# Patient Record
Sex: Female | Born: 1995
Health system: Southern US, Community
[De-identification: ages and names within clinical notes are randomized; demographics above are authoritative.]

## PROBLEM LIST (undated history)

## (undated) DIAGNOSIS — J45909 Unspecified asthma, uncomplicated: Secondary | ICD-10-CM

## (undated) DIAGNOSIS — M549 Dorsalgia, unspecified: Secondary | ICD-10-CM

## (undated) DIAGNOSIS — E079 Disorder of thyroid, unspecified: Secondary | ICD-10-CM

## (undated) DIAGNOSIS — B009 Herpesviral infection, unspecified: Secondary | ICD-10-CM

## (undated) DIAGNOSIS — E669 Obesity, unspecified: Secondary | ICD-10-CM

## (undated) DIAGNOSIS — M62838 Other muscle spasm: Secondary | ICD-10-CM

## (undated) DIAGNOSIS — K219 Gastro-esophageal reflux disease without esophagitis: Secondary | ICD-10-CM

## (undated) HISTORY — DX: Unspecified asthma, uncomplicated: J45.909

## (undated) HISTORY — PX: WISDOM TOOTH EXTRACTION: SHX21

## (undated) HISTORY — DX: Other muscle spasm: M62.838

## (undated) HISTORY — DX: Dorsalgia, unspecified: M54.9

## (undated) HISTORY — PX: TOE SURGERY: SHX1073

## (undated) HISTORY — DX: Herpesviral infection, unspecified: B00.9

## (undated) HISTORY — DX: Gastro-esophageal reflux disease without esophagitis: K21.9

## (undated) HISTORY — DX: Obesity, unspecified: E66.9

## (undated) HISTORY — DX: Disorder of thyroid, unspecified: E07.9

---
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
## 2017-01-25 ENCOUNTER — Ambulatory Visit: Payer: Medicaid Other

## 2017-01-28 ENCOUNTER — Ambulatory Visit (INDEPENDENT_AMBULATORY_CARE_PROVIDER_SITE_OTHER): Payer: Medicaid Other

## 2017-01-28 DIAGNOSIS — Z3042 Encounter for surveillance of injectable contraceptive: Secondary | ICD-10-CM

## 2017-01-28 DIAGNOSIS — Z308 Encounter for other contraceptive management: Secondary | ICD-10-CM | POA: Diagnosis not present

## 2017-01-28 MED ORDER — MEDROXYPROGESTERONE ACETATE 150 MG/ML IM SUSP
150.0000 mg | Freq: Once | INTRAMUSCULAR | Status: AC
  Administered 2017-01-28: 150 mg via INTRAMUSCULAR

## 2017-01-28 NOTE — Progress Notes (Signed)
Nurse visit for pt's supplied Depo given R del w/o difficulty. Pt aware Next Depo due date.

## 2017-04-26 ENCOUNTER — Ambulatory Visit (INDEPENDENT_AMBULATORY_CARE_PROVIDER_SITE_OTHER): Payer: Medicaid Other

## 2017-04-26 DIAGNOSIS — Z308 Encounter for other contraceptive management: Secondary | ICD-10-CM

## 2017-04-26 DIAGNOSIS — Z3042 Encounter for surveillance of injectable contraceptive: Secondary | ICD-10-CM | POA: Diagnosis not present

## 2017-04-26 MED ORDER — MEDROXYPROGESTERONE ACETATE 150 MG/ML IM SUSP
150.0000 mg | Freq: Once | INTRAMUSCULAR | Status: AC
  Administered 2017-04-26: 150 mg via INTRAMUSCULAR

## 2017-04-26 NOTE — Progress Notes (Signed)
Nurse visit for pt supply Depo. Pt is on time for injection. Depo given R Del w/o difficulty. Next Depo due 11/20. AEX due 05/2017. Pt agrees.

## 2017-07-12 ENCOUNTER — Other Ambulatory Visit: Payer: Self-pay | Admitting: Certified Nurse Midwife

## 2017-07-12 ENCOUNTER — Telehealth: Payer: Self-pay | Admitting: *Deleted

## 2017-07-12 ENCOUNTER — Other Ambulatory Visit: Payer: Self-pay

## 2017-07-12 ENCOUNTER — Ambulatory Visit (INDEPENDENT_AMBULATORY_CARE_PROVIDER_SITE_OTHER): Payer: Medicaid Other | Admitting: Obstetrics

## 2017-07-12 ENCOUNTER — Other Ambulatory Visit (HOSPITAL_COMMUNITY)
Admission: RE | Admit: 2017-07-12 | Discharge: 2017-07-12 | Disposition: A | Discharge status: Home / Self Care | Payer: Medicaid Other | Source: Ambulatory Visit | Attending: Obstetrics | Admitting: Obstetrics

## 2017-07-12 ENCOUNTER — Encounter: Payer: Self-pay | Admitting: Obstetrics

## 2017-07-12 VITALS — BP 129/87 | HR 87 | Ht 68.0 in | Wt 365.0 lb

## 2017-07-12 DIAGNOSIS — Z01419 Encounter for gynecological examination (general) (routine) without abnormal findings: Secondary | ICD-10-CM

## 2017-07-12 DIAGNOSIS — E66813 Obesity, class 3: Secondary | ICD-10-CM

## 2017-07-12 DIAGNOSIS — Z01411 Encounter for gynecological examination (general) (routine) with abnormal findings: Secondary | ICD-10-CM | POA: Insufficient documentation

## 2017-07-12 DIAGNOSIS — Z309 Encounter for contraceptive management, unspecified: Secondary | ICD-10-CM | POA: Diagnosis not present

## 2017-07-12 DIAGNOSIS — N946 Dysmenorrhea, unspecified: Secondary | ICD-10-CM | POA: Diagnosis not present

## 2017-07-12 DIAGNOSIS — Z3042 Encounter for surveillance of injectable contraceptive: Secondary | ICD-10-CM | POA: Diagnosis not present

## 2017-07-12 DIAGNOSIS — F17210 Nicotine dependence, cigarettes, uncomplicated: Secondary | ICD-10-CM | POA: Insufficient documentation

## 2017-07-12 DIAGNOSIS — N898 Other specified noninflammatory disorders of vagina: Secondary | ICD-10-CM | POA: Diagnosis not present

## 2017-07-12 DIAGNOSIS — Z6841 Body Mass Index (BMI) 40.0 and over, adult: Secondary | ICD-10-CM | POA: Diagnosis not present

## 2017-07-12 MED ORDER — MEDROXYPROGESTERONE ACETATE 150 MG/ML IM SUSP
150.0000 mg | Freq: Once | INTRAMUSCULAR | Status: AC
  Administered 2017-07-12: 150 mg via INTRAMUSCULAR

## 2017-07-12 MED ORDER — MEDROXYPROGESTERONE ACETATE 150 MG/ML IM SUSP: 150.0000 mg | INTRAMUSCULAR | 4 refills | Status: DC

## 2017-07-12 NOTE — Telephone Encounter (Signed)
Rx sent to pharmacy   

## 2017-07-12 NOTE — Telephone Encounter (Signed)
Please review for refill.  

## 2017-07-12 NOTE — Progress Notes (Signed)
Subjective:        Patricia Duran is a 21 y.o. female here for a routine exam.  Current complaints: Occasional breast tenderness.  Personal health questionnaire:  Is patient Ashkenazi Jewish, have a family history of breast and/or ovarian cancer: no Is there a family history of uterine cancer diagnosed at age < 10650, gastrointestinal cancer, urinary tract cancer, family member who is a Personnel officerLynch syndrome-associated carrier: no Is the patient overweight and hypertensive, family history of diabetes, personal history of gestational diabetes, preeclampsia or PCOS: no Is patient over 155, have PCOS,  family history of premature CHD under age 19, diabetes, smoke, have hypertension or peripheral artery disease:  no At any time, has a partner hit, kicked or otherwise hurt or frightened you?: no Over the past 2 weeks, have you felt down, depressed or hopeless?: no Over the past 2 weeks, have you felt little interest or pleasure in doing things?:no   Gynecologic History No LMP recorded. Patient has had an injection. Contraception: Depo-Provera injections Last Pap: n/a. Results were: n/a Last mammogram: n/a. Results were: n/a  Obstetric History OB History  Gravida Para Term Preterm AB Living  0 0 0 0 0 0  SAB TAB Ectopic Multiple Live Births  0 0 0 0 0        Past Medical History:  Diagnosis Date  . Asthma   . Obesity     Past Surgical History:  Procedure Laterality Date  . TOE SURGERY       Current Outpatient Medications:  .  cyclobenzaprine (FLEXERIL) 10 MG tablet, Take 1 tablet (10 mg total) by mouth 2 (two) times daily as needed for muscle spasms., Disp: 20 tablet, Rfl: 0 .  medroxyPROGESTERone (DEPO-PROVERA) 150 MG/ML injection, INJECT 1 ML INTO MUSCLE EVERY 3 MONTHS, Disp: 1 mL, Rfl: 4 .  medroxyPROGESTERone (DEPO-PROVERA) 150 MG/ML injection, Inject 1 mL (150 mg total) into the muscle every 3 (three) months., Disp: 1 mL, Rfl: 4 .  albuterol (PROVENTIL HFA;VENTOLIN HFA)  108 (90 BASE) MCG/ACT inhaler, Inhale 1 puff into the lungs every 6 (six) hours as needed for wheezing or shortness of breath., Disp: , Rfl:  .  ibuprofen (ADVIL,MOTRIN) 800 MG tablet, Take 1 tablet (800 mg total) by mouth 3 (three) times daily. (Patient not taking: Reported on 11/18/2016), Disp: 21 tablet, Rfl: 0 No Known Allergies  Social History   Tobacco Use  . Smoking status: Current Some Day Smoker    Types: Cigars  . Smokeless tobacco: Never Used  . Tobacco comment: black and milds  Substance Use Topics  . Alcohol use: No    Family History  Problem Relation Age of Onset  . Asthma Other   . Hypertension Other   . Hyperlipidemia Other   . Sleep apnea Other   . Stroke Other   . Heart attack Other       Review of Systems  Constitutional: negative for fatigue and weight loss Respiratory: negative for cough and wheezing Cardiovascular: negative for chest pain, fatigue and palpitations Gastrointestinal: negative for abdominal pain and change in bowel habits Musculoskeletal:negative for myalgias Neurological: negative for gait problems and tremors Behavioral/Psych: negative for abusive relationship, depression Endocrine: negative for temperature intolerance    Genitourinary:negative for abnormal menstrual periods, genital lesions, hot flashes, sexual problems and vaginal discharge Integument/breast: positive for breast tenderness    Objective:       BP 129/87   Pulse 87   Ht 5\' 8"  (1.727 m)   Wt Marland Kitchen(!)  365 lb (165.6 kg)   BMI 55.50 kg/m  General:   alert  Skin:   no rash or abnormalities  Lungs:   clear to auscultation bilaterally  Heart:   regular rate and rhythm, S1, S2 normal, no murmur, click, rub or gallop  Breasts:   normal without suspicious masses, skin or nipple changes or axillary nodes  Abdomen:  normal findings: no organomegaly, soft, non-tender and no hernia  Pelvis:  External genitalia: normal general appearance Urinary system: urethral meatus normal and  bladder without fullness, nontender Vaginal: normal without tenderness, induration or masses Cervix: normal appearance Adnexa: normal bimanual exam Uterus: anteverted and non-tender, normal size   Lab Review Urine pregnancy test Labs reviewed yes Radiologic studies reviewed no  50% of 20 min visit spent on counseling and coordination of care.    Assessment:     1. Well woman exam Rx: - Cervicovaginal ancillary only  2. Surveillance for Depo-Provera contraception - pleased with Depo  3. Vaginal discharge Rx: - Cervicovaginal ancillary only  4. Class 3 severe obesity due to excess calories without serious comorbidity with body mass index (BMI) of 50.0 to 59.9 in adult [facility](HCC) - recommended program that includes caloric restriction. Exercise and behavioral modification  5. Dysmenorrhea - stable on Depo   6. Tobacco dependence due to cigarettes - tobacco cessation recommended - medical therapy recommended ,I.e Chantix   Plan:    Education reviewed: calcium supplements, depression evaluation, low fat, low cholesterol diet, safe sex/STD prevention, self breast exams, smoking cessation and weight bearing exercise. Contraception: Depo-Provera injections. Follow up in: 1 year.   Meds ordered this encounter  Medications  . medroxyPROGESTERone (DEPO-PROVERA) injection 150 mg   No orders of the defined types were placed in this encounter.

## 2017-07-12 NOTE — Telephone Encounter (Signed)
Pt has an appt tody for 3pm for Annual /depo but patient has nor refills at pharmacy,please advise..Marland Kitchen

## 2017-07-12 NOTE — Progress Notes (Signed)
Pt was given depo injection at today's visit. Pt supplied depo for today's visit.  Pt tolerated well. Pt advised to RTO 2/5-19/2019 for next injection.   Administrations This Visit    medroxyPROGESTERone (DEPO-PROVERA) injection 150 mg    Admin Date 07/12/2017 Action Given Dose 150 mg Route Intramuscular Administered By Lanney GinsFoster, Suzanne D, CMA

## 2017-07-13 LAB — CERVICOVAGINAL ANCILLARY ONLY
Bacterial vaginitis: NEGATIVE
CHLAMYDIA, DNA PROBE: NEGATIVE
Candida vaginitis: NEGATIVE
Neisseria Gonorrhea: NEGATIVE
TRICH (WINDOWPATH): NEGATIVE

## 2017-10-03 ENCOUNTER — Ambulatory Visit: Payer: Medicaid Other

## 2017-10-06 ENCOUNTER — Telehealth: Payer: Self-pay | Admitting: *Deleted

## 2017-10-06 NOTE — Telephone Encounter (Signed)
ERROR

## 2017-10-07 ENCOUNTER — Encounter: Payer: Self-pay | Admitting: *Deleted

## 2017-10-07 ENCOUNTER — Ambulatory Visit (INDEPENDENT_AMBULATORY_CARE_PROVIDER_SITE_OTHER): Payer: BLUE CROSS/BLUE SHIELD | Admitting: Pediatrics

## 2017-10-07 DIAGNOSIS — Z3042 Encounter for surveillance of injectable contraceptive: Secondary | ICD-10-CM | POA: Diagnosis not present

## 2017-10-07 MED ORDER — MEDROXYPROGESTERONE ACETATE 150 MG/ML IM SUSP
150.0000 mg | Freq: Once | INTRAMUSCULAR | Status: AC
  Administered 2017-10-07: 150 mg via INTRAMUSCULAR

## 2017-10-07 NOTE — Progress Notes (Signed)
Chart reviewed for nurse visit. Agree with plan of care.   Rolm Bookbindereill, [place] Dominique M, [place] 10/07/2017 11:34 AM

## 2017-12-30 ENCOUNTER — Encounter (HOSPITAL_COMMUNITY): Payer: Self-pay

## 2017-12-30 ENCOUNTER — Emergency Department (HOSPITAL_COMMUNITY): Payer: BLUE CROSS/BLUE SHIELD

## 2017-12-30 ENCOUNTER — Emergency Department (HOSPITAL_COMMUNITY)
Admission: EM | Admit: 2017-12-30 | Discharge: 2017-12-30 | Disposition: A | Discharge status: Home / Self Care | Payer: BLUE CROSS/BLUE SHIELD | Attending: Emergency Medicine | Admitting: Emergency Medicine

## 2017-12-30 DIAGNOSIS — B9789 Other viral agents as the cause of diseases classified elsewhere: Secondary | ICD-10-CM | POA: Insufficient documentation

## 2017-12-30 DIAGNOSIS — J029 Acute pharyngitis, unspecified: Secondary | ICD-10-CM | POA: Diagnosis present

## 2017-12-30 DIAGNOSIS — J069 Acute upper respiratory infection, unspecified: Secondary | ICD-10-CM

## 2017-12-30 DIAGNOSIS — F17210 Nicotine dependence, cigarettes, uncomplicated: Secondary | ICD-10-CM | POA: Insufficient documentation

## 2017-12-30 DIAGNOSIS — R0981 Nasal congestion: Secondary | ICD-10-CM | POA: Diagnosis not present

## 2017-12-30 DIAGNOSIS — R05 Cough: Secondary | ICD-10-CM | POA: Insufficient documentation

## 2017-12-30 LAB — GROUP A STREP BY PCR: GROUP A STREP BY PCR: NOT DETECTED

## 2017-12-30 NOTE — Discharge Instructions (Addendum)
Please read instructions below.  Your chest x-ray does not show pneumonia.  Your strep test is negative. This is likely a viral illness and will run its course. You can take tylenol or ibuprofen every 6 hours as needed for sore throat. Drink plenty of water.  Use saline nasal spray for congestion and to prevent postnasal drip - this can contribute to your sore throat and cough. Follow up with your primary care provider as needed.  Return to the ER for inability to swallow liquids, difficulty breathing, or new or worsening symptoms.

## 2017-12-30 NOTE — ED Provider Notes (Signed)
[facility]-EMERGENCY DEPT Provider Note   CSN: [id number] Arrival date & time: 12/30/17  0153     History   Chief Complaint No chief complaint on file.   HPI Patricia Duran is a 22 y.o. female w PMHx asthma, presenting to the ED with complaint of sore throat for about 1 week.  Patient states sore throat is worse with swallowing.  Reports associated runny nose/congestion, and resolved right ear ache.  She states she began having a mild infrequent cough x2 days.  States it is productive of clear and sometimes brownish sputum.  Was [facility] prior to returning home yesterday.  She was given Tylenol and allergy medication for symptoms, which she took for 2 days and stopped due to lack of relief.  No difficulty with her asthma, no fever, difficulty breathing or swallowing, or known sick contacts.  The history is provided by the patient.    Past Medical History:  Diagnosis Date  . Asthma   . Obesity     Patient Active Problem List   Diagnosis Date Noted  . Dysmenorrhea 05/27/2016    Past Surgical History:  Procedure Laterality Date  . TOE SURGERY       OB History    Gravida  0   Para  0   Term  0   Preterm  0   AB  0   Living  0     SAB  0   TAB  0   Ectopic  0   Multiple  0   Live Births  0            Home Medications    Prior to Admission medications   Medication Sig Start Date End Date Taking? Authorizing Provider  albuterol (PROVENTIL HFA;VENTOLIN HFA) 108 (90 BASE) MCG/ACT inhaler Inhale 1 puff into the lungs every 6 (six) hours as needed for wheezing or shortness of breath.    [provider]  cyclobenzaprine (FLEXERIL) 10 MG tablet Take 1 tablet (10 mg total) by mouth 2 (two) times daily as needed for muscle spasms. 07/24/16   Blane Ohara, MD  ibuprofen (ADVIL,MOTRIN) 800 MG tablet Take 1 tablet (800 mg total) by mouth 3 (three) times daily. 06/09/14   Elpidio Anis,  PA-C  medroxyPROGESTERone (DEPO-PROVERA) 150 MG/ML injection INJECT 1 ML INTO MUSCLE EVERY 3 MONTHS 07/12/17   Brock Bad, MD  medroxyPROGESTERone (DEPO-PROVERA) 150 MG/ML injection Inject 1 mL (150 mg total) into the muscle every 3 (three) months. 07/12/17   Roe Coombs, CNM    Family History Family History  Problem Relation Age of Onset  . Asthma Other   . Hypertension Other   . Hyperlipidemia Other   . Sleep apnea Other   . Stroke Other   . Heart attack Other     Social History Social History   Tobacco Use  . Smoking status: Current Some Day Smoker    Types: Cigars  . Smokeless tobacco: Never Used  . Tobacco comment: black and milds  Substance Use Topics  . Alcohol use: No  . Drug use: No     Allergies   Patient has no known allergies.   Review of Systems Review of Systems  Constitutional: Negative for fever.  HENT: Positive for congestion, ear pain, rhinorrhea and sore throat. Negative for trouble swallowing and voice change.   Respiratory: Positive for cough. Negative for shortness of breath.      Physical Exam  Updated Vital Signs BP (!) 144/97 (BP Location: Left Arm)   Pulse 100   Temp 99 F (37.2 C) (Oral)   Resp 18   SpO2 98%   Physical Exam  Constitutional: She appears well-developed and well-nourished. No distress.  HENT:  Head: Normocephalic and atraumatic.  Right Ear: Tympanic membrane, external ear and ear canal normal.  Left Ear: Tympanic membrane, external ear and ear canal normal.  Nose: Nose normal.  Mouth/Throat: Uvula is midline and oropharynx is clear and moist. No trismus in the jaw. No uvula swelling. No posterior oropharyngeal edema.  Eyes: Conjunctivae are normal.  Neck: Normal range of motion. Neck supple.  Cardiovascular: Normal rate, regular rhythm, normal heart sounds and intact distal pulses.  Pulmonary/Chest: Effort normal and breath sounds normal. No respiratory distress.  Lymphadenopathy:    She has no  cervical adenopathy.  Psychiatric: She has a normal mood and affect. Her behavior is normal.  Nursing note and vitals reviewed.    ED Treatments / Results  Labs (all labs ordered are listed, but only abnormal results are displayed) Labs Reviewed  GROUP A STREP BY PCR    EKG None  Radiology Dg Chest 2 View  Result Date: 12/30/2017 CLINICAL DATA:  22 year old female with productive cough. EXAM: CHEST - 2 VIEW COMPARISON:  None. FINDINGS: The heart size and mediastinal contours are within normal limits. Both lungs are clear. The visualized skeletal structures are unremarkable. IMPRESSION: No active cardiopulmonary disease. Electronically Signed   By: Elgie Collard M.D.   On: 12/30/2017 02:48    Procedures Procedures (including critical care time)  Medications Ordered in ED Medications - No data to display   Initial Impression / Assessment and Plan / ED Course  I have reviewed the triage vital signs and the nursing notes.  Pertinent labs & imaging results that were available during my care of the patient were reviewed by me and considered in my medical decision making (see chart for details).     Patients symptoms are consistent with URI, likely viral etiology. Afebrile, tolerating secretions.  Lungs clear to auscultation bilaterally. CXR ordered in triage is negative for acute infiltrate. Strep swab done in triage is negative. Discussed that antibiotics are not indicated for viral infections. Pt will be discharged with symptomatic treatment.  Verbalizes understanding and is agreeable with plan. Pt is hemodynamically stable & in NAD prior to dc.  Discussed results, findings, treatment and follow up. Patient advised of return precautions. Patient verbalized understanding and agreed with plan.  Final Clinical Impressions(s) / ED Diagnoses   Final diagnoses:  Viral URI    ED Discharge Orders    None       Salayah Meares, [place] N, PA-C 12/30/17 0544    Ward, Layla Maw,  DO 12/30/17 [phone]

## 2017-12-30 NOTE — ED Triage Notes (Signed)
Pt complains of a productive cough, earache and a sore throat for one week [facility]

## 2018-01-03 ENCOUNTER — Ambulatory Visit (INDEPENDENT_AMBULATORY_CARE_PROVIDER_SITE_OTHER): Payer: BLUE CROSS/BLUE SHIELD

## 2018-01-03 DIAGNOSIS — Z3042 Encounter for surveillance of injectable contraceptive: Secondary | ICD-10-CM

## 2018-01-03 MED ORDER — MEDROXYPROGESTERONE ACETATE 150 MG/ML IM SUSP
150.0000 mg | Freq: Once | INTRAMUSCULAR | Status: AC
  Administered 2018-01-03: 150 mg via INTRAMUSCULAR

## 2018-01-03 NOTE — Progress Notes (Signed)
Nurse visit for pt supplied Depo given L Del w/o difficulty. Next Depo due Jul 30-Aug 13.

## 2018-02-03 ENCOUNTER — Ambulatory Visit (INDEPENDENT_AMBULATORY_CARE_PROVIDER_SITE_OTHER): Payer: BLUE CROSS/BLUE SHIELD | Admitting: Obstetrics

## 2018-02-03 ENCOUNTER — Other Ambulatory Visit (HOSPITAL_COMMUNITY)
Admission: RE | Admit: 2018-02-03 | Discharge: 2018-02-03 | Disposition: A | Discharge status: Home / Self Care | Payer: BLUE CROSS/BLUE SHIELD | Source: Ambulatory Visit | Attending: Obstetrics | Admitting: Obstetrics

## 2018-02-03 ENCOUNTER — Encounter: Payer: Self-pay | Admitting: Obstetrics

## 2018-02-03 VITALS — BP 120/82 | HR 85 | Ht 69.0 in | Wt 370.5 lb

## 2018-02-03 DIAGNOSIS — N926 Irregular menstruation, unspecified: Secondary | ICD-10-CM

## 2018-02-03 DIAGNOSIS — Z3042 Encounter for surveillance of injectable contraceptive: Secondary | ICD-10-CM

## 2018-02-03 DIAGNOSIS — Z6841 Body Mass Index (BMI) 40.0 and over, adult: Secondary | ICD-10-CM | POA: Diagnosis not present

## 2018-02-03 NOTE — Progress Notes (Signed)
Presents for AUB. She has being bleeding for 2 1/2 weeks. Patient has been on DEPO since 2017. Denies pain, discharge, odor, chills.  Had nausea 2 days ago.

## 2018-02-03 NOTE — Progress Notes (Signed)
Subjective:    Patricia Duran is a 22 y.o. female who presents for contraceptive surveillance. The patient has been having irregular bleeding for the past 2 weeks.  She has been on Depo Provera since 2017. The patient is sexually active. Pertinent past medical history: current smoker and obese  The information documented in the HPI was reviewed and verified.  Menstrual History: OB History    Gravida  0   Para  0   Term  0   Preterm  0   AB  0   Living  0     SAB  0   TAB  0   Ectopic  0   Multiple  0   Live Births  0            No LMP recorded. Patient has had an injection.   Patient Active Problem List   Diagnosis Date Noted  . Dysmenorrhea 05/27/2016   Past Medical History:  Diagnosis Date  . Asthma   . Obesity     Past Surgical History:  Procedure Laterality Date  . TOE SURGERY       Current Outpatient Medications:  .  albuterol (PROVENTIL HFA;VENTOLIN HFA) 108 (90 BASE) MCG/ACT inhaler, Inhale 1 puff into the lungs every 6 (six) hours as needed for wheezing or shortness of breath., Disp: , Rfl:  .  cyclobenzaprine (FLEXERIL) 10 MG tablet, Take 1 tablet (10 mg total) by mouth 2 (two) times daily as needed for muscle spasms., Disp: 20 tablet, Rfl: 0 .  ibuprofen (ADVIL,MOTRIN) 800 MG tablet, Take 1 tablet (800 mg total) by mouth 3 (three) times daily., Disp: 21 tablet, Rfl: 0 .  medroxyPROGESTERone (DEPO-PROVERA) 150 MG/ML injection, INJECT 1 ML INTO MUSCLE EVERY 3 MONTHS, Disp: 1 mL, Rfl: 4 .  medroxyPROGESTERone (DEPO-PROVERA) 150 MG/ML injection, Inject 1 mL (150 mg total) into the muscle every 3 (three) months., Disp: 1 mL, Rfl: 4 No Known Allergies  Social History   Tobacco Use  . Smoking status: Current Some Day Smoker    Types: Cigars  . Smokeless tobacco: Never Used  . Tobacco comment: black and milds  Substance Use Topics  . Alcohol use: No    Family History  Problem Relation Age of Onset  . Asthma Other   . Hypertension Other    . Hyperlipidemia Other   . Sleep apnea Other   . Stroke Other   . Heart attack Other        Review of Systems Constitutional: negative for weight loss Genitourinary:Positive for irregular vaginal bleeding   Objective:   BP 120/82   Pulse 85   Ht 5\' 9"  (1.753 m)   Wt (!) 370 lb 8 oz (168.1 kg)   BMI 54.71 kg/m    PE:  Deferred  Lab Review Urine pregnancy test Labs reviewed yes Radiologic studies reviewed no  >50% of 15 min visit spent on counseling and coordination of care.    Assessment:    22 y.o., continuing Depo-Provera injections, no contraindications.   Plan:  1. Irregular bleeding Rx: - Urine cytology ancillary only - Balcoltra ( 2 packs ) dispensed with directions for taper  2. Surveillance for Depo-Provera contraception  3. Class 3 severe obesity due to excess calories without serious comorbidity with body mass index (BMI) of 50.0 to 59.9 in adult [facility]) - program od caloric reduction, exercise and behavioral modification recommended    All questions answered. Chlamydia specimen. Contraception: Depo-Provera injections. Diagnosis explained in detail, including differential.  Follow up as needed. GC specimen.   No orders of the defined types were placed in this encounter.  No orders of the defined types were placed in this encounter.

## 2018-02-07 LAB — URINE CYTOLOGY ANCILLARY ONLY
CHLAMYDIA, DNA PROBE: NEGATIVE
NEISSERIA GONORRHEA: NEGATIVE

## 2018-03-22 ENCOUNTER — Ambulatory Visit (INDEPENDENT_AMBULATORY_CARE_PROVIDER_SITE_OTHER): Payer: BLUE CROSS/BLUE SHIELD

## 2018-03-22 VITALS — BP 135/90 | HR 80 | Wt 368.0 lb

## 2018-03-22 DIAGNOSIS — Z3042 Encounter for surveillance of injectable contraceptive: Secondary | ICD-10-CM | POA: Diagnosis not present

## 2018-03-22 MED ORDER — MEDROXYPROGESTERONE ACETATE 150 MG/ML IM SUSP
150.0000 mg | Freq: Once | INTRAMUSCULAR | Status: AC
  Administered 2018-03-22: 150 mg via INTRAMUSCULAR

## 2018-03-22 NOTE — Progress Notes (Signed)
Pt is here for depo injection. Pt is on time for injection, injection given with out difficulty. Next depo due 10/16-10/30.

## 2018-04-05 DIAGNOSIS — Z7689 Persons encountering health services in other specified circumstances: Secondary | ICD-10-CM | POA: Diagnosis not present

## 2018-04-05 DIAGNOSIS — H52221 Regular astigmatism, right eye: Secondary | ICD-10-CM | POA: Diagnosis not present

## 2018-04-05 DIAGNOSIS — H5213 Myopia, bilateral: Secondary | ICD-10-CM | POA: Diagnosis not present

## 2018-04-14 DIAGNOSIS — Z7689 Persons encountering health services in other specified circumstances: Secondary | ICD-10-CM | POA: Diagnosis not present

## 2018-04-20 DIAGNOSIS — H52221 Regular astigmatism, right eye: Secondary | ICD-10-CM | POA: Diagnosis not present

## 2018-05-08 DIAGNOSIS — A599 Trichomoniasis, unspecified: Secondary | ICD-10-CM | POA: Diagnosis not present

## 2018-05-30 DIAGNOSIS — M545 Low back pain: Secondary | ICD-10-CM | POA: Diagnosis not present

## 2018-06-07 ENCOUNTER — Ambulatory Visit (INDEPENDENT_AMBULATORY_CARE_PROVIDER_SITE_OTHER): Payer: Medicaid Other

## 2018-06-07 DIAGNOSIS — Z7689 Persons encountering health services in other specified circumstances: Secondary | ICD-10-CM | POA: Diagnosis not present

## 2018-06-07 DIAGNOSIS — Z3042 Encounter for surveillance of injectable contraceptive: Secondary | ICD-10-CM

## 2018-06-07 MED ORDER — MEDROXYPROGESTERONE ACETATE 150 MG/ML IM SUSP
150.0000 mg | INTRAMUSCULAR | Status: DC
  Administered 2018-06-07 – 2020-05-15 (×5): 150 mg via INTRAMUSCULAR

## 2018-06-07 NOTE — Progress Notes (Signed)
Nurse visit for pt supplied Depo. Pt is on time for inj. Depo given R Deltoid w/o difficulty. Next Depo due Jan 1-15.

## 2018-06-08 ENCOUNTER — Ambulatory Visit: Payer: Medicaid Other | Attending: Family Medicine | Admitting: Physical Therapy

## 2018-06-08 ENCOUNTER — Encounter: Payer: Self-pay | Admitting: Physical Therapy

## 2018-06-08 DIAGNOSIS — M62830 Muscle spasm of back: Secondary | ICD-10-CM | POA: Diagnosis not present

## 2018-06-08 DIAGNOSIS — Z7689 Persons encountering health services in other specified circumstances: Secondary | ICD-10-CM | POA: Diagnosis not present

## 2018-06-08 DIAGNOSIS — M6281 Muscle weakness (generalized): Secondary | ICD-10-CM | POA: Diagnosis not present

## 2018-06-08 DIAGNOSIS — M545 Low back pain, unspecified: Secondary | ICD-10-CM

## 2018-06-08 DIAGNOSIS — R262 Difficulty in walking, not elsewhere classified: Secondary | ICD-10-CM | POA: Insufficient documentation

## 2018-06-08 NOTE — Progress Notes (Signed)
Agree with A & P. 

## 2018-06-08 NOTE — Therapy (Signed)
[facility] [address] [place], [place], [address] Phone: [phone]   Fax:  [phone]  Physical Therapy Evaluation  Patient Details  Name: Patricia Duran MRN: [id number] Date of Birth: [birth year] Referring Provider (PT): Dr Eula Listen   Encounter Date: 06/08/2018  PT End of Session - 06/08/18 1258    Visit Number  1    Number of Visits  12    Date for PT Re-Evaluation  07/20/18    Authorization Type  MCD requested 12 visits through 07/20/18    PT Start Time  1258    PT Stop Time  1340    PT Time Calculation (min)  42 min    Activity Tolerance  Patient tolerated treatment well       Past Medical History:  Diagnosis Date  . Asthma   . Obesity     Past Surgical History:  Procedure Laterality Date  . TOE SURGERY      There were no vitals filed for this visit.   Subjective Assessment - 06/08/18 1258    Subjective  Pt reports she woke up from a nap about 2 wks ago and she felt some pain in her back and it got worse when she was sitting at her desk doing her homework.  She has been seen for her back before she was hoping the pain would settle down. Has tried tylenol, ice without relief. Saw the ortho Md that week, she has been trying to do the exercise he gave her - has difficulty.  Now having trouble bending forward to put on socks and shoes    Diagnostic tests  x-rays (-) of lumbar region    Patient Stated Goals  move the way she normally moves.     Currently in Pain?  Yes    Pain Score  3     Pain Location  Back    Pain Orientation  Left    Pain Descriptors / Indicators  Pressure    Pain Type  Acute pain    Pain Onset  1 to 4 weeks ago    Pain Frequency  Constant    Aggravating Factors   bending forward, lower body dressing    Pain Relieving Factors  lying prone         [facility] PT Assessment - 06/08/18 0001      Assessment   Medical Diagnosis  Lumbosacral strain    Referring Provider (PT)  Dr  Eula Listen    Onset Date/Surgical Date  05/25/18    Hand Dominance  Right    Next MD Visit  a few months    Prior Therapy  last year for her back with good results      Precautions   Precautions  None      Balance Screen   Has the patient fallen in the past 6 months  No      Prior Function   Level of Independence  Independent with basic ADLs    Vocation  Student    Vocation Requirements  carry books/studying    Leisure  sleep      Observation/Other Assessments   Other Surveys   Other Surveys    Oswestry Disability Index   40% limited      Posture/Postural Control   Posture/Postural Control  Postural limitations    Postural Limitations  --   obesity   Posture Comments  LE valgus      ROM / Strength   AROM /  PROM / Strength  AROM;Strength      AROM   AROM Assessment Site  Lumbar;Hip    Right/Left Hip  --   limited d/t soft tissue approximation   Lumbar Flexion  to top of knees, tight in Lt low back    Lumbar Extension  75% present    Lumbar - Right Rotation  90% present    Lumbar - Left Rotation  75% present       Strength   Strength Assessment Site  Hip;Knee;Lumbar    Right/Left Hip  --   Rt WNL, Lt grossly 5-/5   Right/Left Knee  --   WNL   Lumbar Flexion  --   transverse abdominis poor   Lumbar Extension  --   multifidi poor     Flexibility   Soft Tissue Assessment /Muscle Length  yes    Hamstrings  supine SLR Rt 62, Lt 45    Quadriceps  prone knee flex Rt 122, Lt 112      Palpation   Spinal mobility  no pain with CPA mobs sacrum through lumbar    Palpation comment  no reports of tenderness with palpation in the low back.       Special Tests   Other special tests  (-) SLR and slump test bilat.                 Objective measurements completed on examination: See above findings.      [facility] Adult PT Treatment/Exercise - 06/08/18 0001      Exercises   Exercises  Lumbar      Lumbar Exercises: Stretches   Passive Hamstring Stretch   Left;Right;2 reps   45 sec supine with strap   Quad Stretch  Left;Right   45 sec prone with strap     Lumbar Exercises: Supine   Isometric Hip Flexion  10 reps;5 seconds   with TA contraction, VC for form     Lumbar Exercises: Prone   Other Prone Lumbar Exercises  10 reps 5 sec holds with VC for form pelvic press             PT Education - 06/08/18 1334    Education Details  HEP     Person(s) Educated  Patient    Methods  Explanation;Demonstration;Handout    Comprehension  Returned demonstration;Verbalized understanding;Verbal cues required       PT Short Term Goals - 06/08/18 1351      PT SHORT TERM GOAL #1   Title  I with initial HEP ( 06/22/18)     Baseline  not performing HEP    Time  2    Period  Weeks    Status  New      PT SHORT TERM GOAL #2   Title  improve bilat hamstring flexibility =/> 60 degrees    Baseline  limited around 45 degrees    Time  2    Period  Weeks    Status  New      PT SHORT TERM GOAL #3   Title  ----      PT SHORT TERM GOAL #4   Title  ------        PT Long Term Goals - 06/08/18 1341      PT LONG TERM GOAL #1   Title  Patient will sit for 1 hour in order to sit in classes at school     Baseline  currently tolerates 30'    Time  6  Period  Weeks    Status  New    Target Date  07/20/18      PT LONG TERM GOAL #2   Title  pt will be able to perform lower body dressing without limitations and in normal amount of time    Baseline  has difficulty and occassionally requires assist to donn socks/shoes and pants.     Time  6    Period  Weeks    Status  New    Target Date  07/20/18      PT LONG TERM GOAL #3   Title  I with advanced HEP for core strengthening and LE flexibility to prevent reinjury     Baseline  not performing any HEP     Time  6    Period  Weeks    Status  New    Target Date  07/20/18      PT LONG TERM GOAL #4   Title  Patient will demsotrate full lumbar flexion without pain in order to pick up her  backpack    Baseline  only able to reach to top of her knees     Time  6    Period  Weeks    Status  New    Target Date  07/20/18      PT LONG TERM GOAL #5   Title  improve ODI to -/< 20% limited     Baseline  40% limited     Time  6    Period  Weeks    Status  New    Target Date  07/20/18             Plan - 06/08/18 1347    Clinical Impression Statement  22 yo female with reoccurrence of low back pain.  She has limited lumbar and hip ROM along with significant core weakness.  She is pretty sedentary in her lifestyle attends college and likes to take naps.  She is also overweight.  She would benefit from PT to normalize hip flexibility to decrease pull on back, improve lumbar ROM to restore I with ADLs and instruct in  sustained HEP to prevent this from coming back.      History and Personal Factors relevant to plan of care:  obesity,     Clinical Presentation  Stable    Clinical Decision Making  Low    Rehab Potential  Good    PT Frequency  2x / week    PT Duration  6 weeks    PT Treatment/Interventions  Dry needling;Manual techniques;Moist Heat;Traction;Therapeutic activities;Patient/family education;Taping;Therapeutic exercise;Cryotherapy;Electrical Stimulation    PT Next Visit Plan  progress HEP core     Consulted and Agree with Plan of Care  Patient       Patient will benefit from skilled therapeutic intervention in order to improve the following deficits and impairments:  Pain, Improper body mechanics, Decreased strength, Decreased range of motion  Visit Diagnosis: Acute bilateral low back pain without sciatica - Plan: PT plan of care cert/re-cert  Muscle weakness (generalized) - Plan: PT plan of care cert/re-cert     Problem List Patient Active Problem List   Diagnosis Date Noted  . Dysmenorrhea 05/27/2016    Roderic Scarce PT  06/08/2018, 1:53 PM  [facility] [address] [place], [place],  [address] Phone: [phone]   Fax:  [phone]  Name: Patricia Duran MRN: [id number] Date of Birth: May 09, 1996

## 2018-06-21 ENCOUNTER — Ambulatory Visit: Payer: Medicaid Other | Admitting: Physical Therapy

## 2018-06-21 ENCOUNTER — Encounter: Payer: Self-pay | Admitting: Physical Therapy

## 2018-06-21 DIAGNOSIS — M62830 Muscle spasm of back: Secondary | ICD-10-CM

## 2018-06-21 DIAGNOSIS — R262 Difficulty in walking, not elsewhere classified: Secondary | ICD-10-CM | POA: Diagnosis not present

## 2018-06-21 DIAGNOSIS — M545 Low back pain, unspecified: Secondary | ICD-10-CM

## 2018-06-21 DIAGNOSIS — M6281 Muscle weakness (generalized): Secondary | ICD-10-CM

## 2018-06-21 NOTE — Therapy (Signed)
Patricia Duran, [place], [address] Phone: [phone]   Fax:  [phone]  Physical Therapy Treatment  Patient Details  Name: Patricia Duran MRN: [id number] Date of Birth: [birth year] Referring Provider (PT): Dr Rhina Brackett   Encounter Date: 06/21/2018  PT End of Session - 06/21/18 1331    Visit Number  2    Number of Visits  12    Date for PT Re-Evaluation  07/20/18    Authorization Type  MCD requested 12 visits through 07/20/18    PT Start Time  1331    PT Stop Time  1415    PT Time Calculation (min)  44 min    Activity Tolerance  Patient tolerated treatment well       Past Medical History:  Diagnosis Date  . Asthma   . Obesity     Past Surgical History:  Procedure Laterality Date  . TOE SURGERY      There were no vitals filed for this visit.  Subjective Assessment - 06/21/18 1331    Subjective  Pt reports she is moving better however still has pain and difficulty donning her socks and shoes and if she is still less than 50' in her class.  She is going to start going the gym at school this week.     Patient Stated Goals  move the way she normally moves.     Currently in Pain?  No/denies                       [facility] Adult PT Treatment/Exercise - 06/21/18 0001      Exercises   Exercises  Lumbar      Lumbar Exercises: Stretches   Passive Hamstring Stretch  Left;Right;2 reps;30 seconds   both supine with strap and seated FWD lean   Single Knee to Chest Stretch  Left;Right;20 seconds   with strap behind knee   Quad Stretch  Left;Right;2 reps;30 seconds   prone with strap   ITB Stretch  Left;Right;30 seconds   modified      Lumbar Exercises: Seated   Other Seated Lumbar Exercises  10x5 sec FWD reach holding 5# with core engaged      Lumbar Exercises: Supine   Clam  15 reps   eac side with TA engagement   Bent Knee Raise  15 reps   each side with TA engagement    Bridge  10 reps;Compliant   then 10 reps lift, half lower, lift, rest   Straight Leg Raise  --   attempted - pt unable to perform safely with good form   Other Supine Lumbar Exercises  10 reps marching with TA engagement and overhead reaches holding 5#    Other Supine Lumbar Exercises  LTR with abdominal engagement to bring legs up 10 reps each side      Lumbar Exercises: Prone   Other Prone Lumbar Exercises  5 reps upper body lifts, arms at sides, T, goal post and Y                PT Short Term Goals - 06/21/18 1353      PT SHORT TERM GOAL #1   Title  I with initial HEP ( 06/22/18)     Status  Achieved      PT SHORT TERM GOAL #2   Title  improve bilat hamstring flexibility =/> 60 degrees    Baseline  pt still very limited  Status  On-going        PT Long Term Goals - 06/21/18 1353      PT LONG TERM GOAL #1   Title  Patient will sit for 1 hour in order to sit in classes at school     Status  On-going      PT LONG TERM GOAL #2   Title  pt will be able to perform lower body dressing without limitations and in normal amount of time    Status  Partially Met   all but socks and shoes     PT LONG TERM GOAL #3   Title  I with advanced HEP for core strengthening and LE flexibility to prevent reinjury     Status  On-going      PT LONG TERM GOAL #4   Title  Patient will demsotrate full lumbar flexion without pain in order to pick up her backpack    Status  On-going      PT LONG TERM GOAL #5   Title  improve ODI to -/< 20% limited     Status  On-going            Plan - 06/21/18 1409    Clinical Impression Statement  This is Patricia Duran's second visit, she is still very tight in bilat hamstrings, the quads have improved flexibility.  She reports a lot of pain and difficulty with supine HS stretch with strap so she will start doing it in a seated position This will improve her compliance with the stretc    Rehab Potential  Good    PT Frequency  2x / week    PT  Duration  6 weeks    PT Treatment/Interventions  Dry needling;Manual techniques;Moist Heat;Traction;Therapeutic activities;Patient/family education;Taping;Therapeutic exercise;Cryotherapy;Electrical Stimulation    PT Next Visit Plan  progress HEP core     Consulted and Agree with Plan of Care  Patient       Patient will benefit from skilled therapeutic intervention in order to improve the following deficits and impairments:  Pain, Improper body mechanics, Decreased strength, Decreased range of motion  Visit Diagnosis: Acute bilateral low back pain without sciatica  Muscle weakness (generalized)  Muscle spasm of back  Difficulty in walking, not elsewhere classified     Problem List Patient Active Problem List   Diagnosis Date Noted  . Dysmenorrhea 05/27/2016    Jeral Pinch PT 06/21/2018, 2:17 PM  [facility] [address] [place], [place], [address] Phone: [phone]   Fax:  [phone]  Name: Patricia Duran MRN: [id number] Date of Birth: 1995/09/08

## 2018-06-26 ENCOUNTER — Encounter: Payer: Self-pay | Admitting: Physical Therapy

## 2018-06-26 ENCOUNTER — Ambulatory Visit: Payer: Medicaid Other | Attending: Family Medicine | Admitting: Physical Therapy

## 2018-06-26 DIAGNOSIS — M545 Low back pain, unspecified: Secondary | ICD-10-CM

## 2018-06-26 DIAGNOSIS — M62830 Muscle spasm of back: Secondary | ICD-10-CM | POA: Diagnosis not present

## 2018-06-26 DIAGNOSIS — M6281 Muscle weakness (generalized): Secondary | ICD-10-CM | POA: Insufficient documentation

## 2018-06-26 DIAGNOSIS — R262 Difficulty in walking, not elsewhere classified: Secondary | ICD-10-CM | POA: Insufficient documentation

## 2018-06-26 DIAGNOSIS — Z7689 Persons encountering health services in other specified circumstances: Secondary | ICD-10-CM | POA: Diagnosis not present

## 2018-06-26 NOTE — Patient Instructions (Addendum)

## 2018-06-26 NOTE — Therapy (Signed)
[facility] [address] [place], [place], [address] Phone: [phone]   Fax:  [phone]  Physical Therapy Treatment  Patient Details  Name: Patricia Duran MRN: [id number] Date of Birth: 1996/07/11 Referring Provider (PT): Dr Eula Listen   Encounter Date: July 08, 2018  PT End of Session - 07-08-2018 1410    Visit Number  3    Number of Visits  12    Date for PT Re-Evaluation  07/20/18    Authorization Type  MCD requested 12 visits through 07/20/18    PT Start Time  1330    PT Stop Time  1415    PT Time Calculation (min)  45 min    Activity Tolerance  Patient tolerated treatment well    Behavior During Therapy  [facility] for tasks assessed/performed       Past Medical History:  Diagnosis Date  . Asthma   . Obesity     Past Surgical History:  Procedure Laterality Date  . TOE SURGERY      There were no vitals filed for this visit.  Subjective Assessment - 07/08/18 1334    Subjective  Dies the exercises.  hamstrings still tight. Longstanding.    Low back  intermittant.    Shoes and socke still difficult    Currently in Pain?  Yes    Pain Score  3     Pain Location  Back    Pain Orientation  Lower    Pain Descriptors / Indicators  Pressure   stiff   Pain Type  Acute pain    Pain Radiating Towards  no    Aggravating Factors   lower body dressing ,  sitting down 50 minutes then getting up.   uncomfortable chairs.     Pain Relieving Factors  prone.     Multiple Pain Sites  No                       [facility] Adult PT Treatment/Exercise - 07/08/18 0001      Self-Care   Self-Care  ADL's;Posture    ADL's  handout reviewed.  there are a few things she needs to work on.      Posture  sitting and standing practiced.  Able to improve with cues.       Exercises   Exercises  Lumbar      Lumbar Exercises: Stretches   Passive Hamstring Stretch  3 reps;30 seconds    Single Knee to Chest Stretch  2 reps;20  seconds    Quad Stretch  2 reps;30 seconds      Lumbar Exercises: Aerobic   Recumbent Bike  5 minutes L1   no pain     Lumbar Exercises: Machines for Strengthening   Leg Press  10 x 1 , 2 plates ,  cued initially      Lumbar Exercises: Supine   Clam  10 reps    Bent Knee Raise  10 reps    Bridge  10 reps;Compliant   then 10 reps lift, half lower, lift, rest   Bridge Limitations  HEP             PT Education - 07/08/2018 1401    Education Details  ADL  body mechanics.  Posture ed    Person(s) Educated  Patient    Methods  Explanation;Demonstration;Tactile cues;Verbal cues;Handout    Comprehension  Returned demonstration       PT Short Term Goals - July 08, 2018 1755  PT SHORT TERM GOAL #1   Title  I with initial HEP ( 06/22/18)     Time  2    Period  Weeks    Status  Achieved      PT SHORT TERM GOAL #2   Title  improve bilat hamstring flexibility =/> 60 degrees    Baseline  improving with compliance    Time  2    Period  Weeks    Status  On-going        PT Long Term Goals - 06/21/18 1353      PT LONG TERM GOAL #1   Title  Patient will sit for 1 hour in order to sit in classes at school     Status  On-going      PT LONG TERM GOAL #2   Title  pt will be able to perform lower body dressing without limitations and in normal amount of time    Status  Partially Met   all but socks and shoes     PT LONG TERM GOAL #3   Title  I with advanced HEP for core strengthening and LE flexibility to prevent reinjury     Status  On-going      PT LONG TERM GOAL #4   Title  Patient will demsotrate full lumbar flexion without pain in order to pick up her backpack    Status  On-going      PT LONG TERM GOAL #5   Title  improve ODI to -/< 20% limited     Status  On-going            Plan - 06/26/18 1411    Clinical Impression Statement  Able to progress toward exercise goals and posture education.  no pain increased during session.  Patient is motivated and  interested in education./ exercises.     PT Next Visit Plan  progress HEP core continue leg press.    PT Home Exercise Plan  bridge.   She has other HEP issued no longer listed.    Consulted and Agree with Plan of Care  Patient       Patient will benefit from skilled therapeutic intervention in order to improve the following deficits and impairments:     Visit Diagnosis: Acute bilateral low back pain without sciatica  Muscle weakness (generalized)  Muscle spasm of back  Difficulty in walking, not elsewhere classified     Problem List Patient Active Problem List   Diagnosis Date Noted  . Dysmenorrhea 05/27/2016    Tenicia Gural PTA 06/26/2018, 5:56 PM  [facility] [address] [place], [place], [address] Phone: [phone]   Fax:  [phone]  Name: Patricia Duran MRN: [id number] Date of Birth: 03-Mar-1996

## 2018-06-28 ENCOUNTER — Ambulatory Visit: Payer: Medicaid Other | Admitting: Physical Therapy

## 2018-06-28 ENCOUNTER — Encounter: Payer: Self-pay | Admitting: Physical Therapy

## 2018-06-28 DIAGNOSIS — M62830 Muscle spasm of back: Secondary | ICD-10-CM | POA: Diagnosis not present

## 2018-06-28 DIAGNOSIS — M545 Low back pain, unspecified: Secondary | ICD-10-CM

## 2018-06-28 DIAGNOSIS — R262 Difficulty in walking, not elsewhere classified: Secondary | ICD-10-CM

## 2018-06-28 DIAGNOSIS — M6281 Muscle weakness (generalized): Secondary | ICD-10-CM | POA: Diagnosis not present

## 2018-06-28 DIAGNOSIS — Z7689 Persons encountering health services in other specified circumstances: Secondary | ICD-10-CM | POA: Diagnosis not present

## 2018-06-28 NOTE — Therapy (Signed)
[place], [place], [address] Phone: [phone]   Fax:  [phone]  Physical Therapy Treatment  Patient Details  Name: Patricia Duran MRN: [id number] Date of Birth: 05-Sep-1995 Referring Provider (PT): Dr Rhina Brackett   Encounter Date: 06/28/2018  PT End of Session - 06/28/18 1257    Visit Number  4    Number of Visits  12    Date for PT Re-Evaluation  07/20/18    Authorization Type  MCD requested 12 visits through 07/20/18    PT Start Time  1257    PT Stop Time  1342    PT Time Calculation (min)  45 min    Activity Tolerance  Patient tolerated treatment well       Past Medical History:  Diagnosis Date  . Asthma   . Obesity     Past Surgical History:  Procedure Laterality Date  . TOE SURGERY      There were no vitals filed for this visit.  Subjective Assessment - 06/28/18 1258    Subjective  Miu reports she felt good after the last treatment and is going to pratice using good mechanics for IADLs    Patient Stated Goals  move the way she normally moves.     Currently in Pain?  No/denies         [facility] PT Assessment - 06/28/18 0001      AROM   Lumbar Flexion  to mid shin                   [facility] Adult PT Treatment/Exercise - 06/28/18 0001      Exercises   Exercises  Lumbar      Lumbar Exercises: Stretches   Passive Hamstring Stretch  Left;Right;2 reps;30 seconds   seated with FWD lean   Other Lumbar Stretch Exercise  LTR with legs crossed in hooklying    Other Lumbar Stretch Exercise  3x10 sec leg lengtheners each side      Lumbar Exercises: Aerobic   Recumbent Bike  L2x5'      Lumbar Exercises: Machines for Strengthening   Cybex Knee Extension  15 reps, 25#    Other Lumbar Machine Exercise  hip machine standing, 2 plates, 15 reps ext, abduction each side, VC for form      Lumbar Exercises: Seated   Sit to Stand  15 reps   holding 7# and reaching it  out in front with stand   Other Seated Lumbar Exercises  10 reps on blue unstable surface with BWD leans, LAQ with 5 sec hold, alt arm/leg lifting      Lumbar Exercises: Supine   Bridge  Compliant;20 reps   while performing lat pull down with7#      Lumbar Exercises: Prone   Opposite Arm/Leg Raise  Left arm/Right leg;Right arm/Left leg;10 reps      Lumbar Exercises: Quadruped   Single Arm Raise  Left;Right;10 reps    Straight Leg Raise  10 reps   VC for form and to keep level pelvis              PT Short Term Goals - 06/26/18 1755      PT SHORT TERM GOAL #1   Title  I with initial HEP ( 06/22/18)     Time  2    Period  Weeks    Status  Achieved      PT SHORT TERM GOAL #2   Title  improve bilat hamstring flexibility =/> 60 degrees    Baseline  improving with compliance    Time  2    Period  Weeks    Status  On-going        PT Long Term Goals - 06/21/18 1353      PT LONG TERM GOAL #1   Title  Patient will sit for 1 hour in order to sit in classes at school     Status  On-going      PT LONG TERM GOAL #2   Title  pt will be able to perform lower body dressing without limitations and in normal amount of time    Status  Partially Met   all but socks and shoes     PT LONG TERM GOAL #3   Title  I with advanced HEP for core strengthening and LE flexibility to prevent reinjury     Status  On-going      PT LONG TERM GOAL #4   Title  Patient will demsotrate full lumbar flexion without pain in order to pick up her backpack    Status  On-going      PT LONG TERM GOAL #5   Title  improve ODI to -/< 20% limited     Status  On-going            Plan - 06/28/18 1308    Clinical Impression Statement  Desa uses knee hyperextension while performing exercise in standing due to functional weakness in her quads. Her core is getting stronger and she is tolerating core work with good form with what we are doing so far.      Rehab Potential  Good    PT Frequency  2x  / week    PT Duration  6 weeks    PT Treatment/Interventions  Dry needling;Manual techniques;Moist Heat;Traction;Therapeutic activities;Patient/family education;Taping;Therapeutic exercise;Cryotherapy;Electrical Stimulation    PT Next Visit Plan  progress core and leg work, needs to strengthen quads a lot.     Consulted and Agree with Plan of Care  Patient       Patient will benefit from skilled therapeutic intervention in order to improve the following deficits and impairments:  Pain, Improper body mechanics, Decreased strength, Decreased range of motion  Visit Diagnosis: Acute bilateral low back pain without sciatica  Muscle weakness (generalized)  Muscle spasm of back  Difficulty in walking, not elsewhere classified     Problem List Patient Active Problem List   Diagnosis Date Noted  . Dysmenorrhea 05/27/2016    Jeral Pinch PT  06/28/2018, 1:44 PM  [facility] [address] [place], [place], [address] Phone: [phone]   Fax:  [phone]  Name: Patricia Duran MRN: [id number] Date of Birth: 1996/06/16

## 2018-07-03 ENCOUNTER — Ambulatory Visit: Payer: Medicaid Other | Admitting: Physical Therapy

## 2018-07-03 ENCOUNTER — Encounter: Payer: Self-pay | Admitting: Physical Therapy

## 2018-07-03 DIAGNOSIS — M62830 Muscle spasm of back: Secondary | ICD-10-CM | POA: Diagnosis not present

## 2018-07-03 DIAGNOSIS — R262 Difficulty in walking, not elsewhere classified: Secondary | ICD-10-CM | POA: Diagnosis not present

## 2018-07-03 DIAGNOSIS — M545 Low back pain, unspecified: Secondary | ICD-10-CM

## 2018-07-03 DIAGNOSIS — M6281 Muscle weakness (generalized): Secondary | ICD-10-CM

## 2018-07-03 DIAGNOSIS — Z7689 Persons encountering health services in other specified circumstances: Secondary | ICD-10-CM | POA: Diagnosis not present

## 2018-07-03 NOTE — Therapy (Signed)
[facility] Outpatient Rehabilitation [facility] [address] [place], [place], [address] Phone: [phone]   Fax:  [phone]  Physical Therapy Treatment  Patient Details  Name: Patricia Duran MRN: [id number] Date of Birth: 03-Feb-1996 Referring Provider (PT): Dr Eula Listen   Encounter Date: 07/03/2018  PT End of Session - 07/03/18 1416    Visit Number  5    Number of Visits  12    Date for PT Re-Evaluation  07/20/18    Authorization Type  MCD approved 12 visits    Authorization Time Period  10/29- 12/9     Authorization - Visit Number  4    Authorization - Number of Visits  12    PT Start Time  1330    PT Stop Time  1415    PT Time Calculation (min)  45 min    Activity Tolerance  Patient tolerated treatment well    Behavior During Therapy  [facility] for tasks assessed/performed       Past Medical History:  Diagnosis Date  . Asthma   . Obesity     Past Surgical History:  Procedure Laterality Date  . TOE SURGERY      There were no vitals filed for this visit.  Subjective Assessment - 07/03/18 1334    Subjective  She went to the gym Thursday and did some things.  Leg press, knee ext , bike, walking.     Currently in Pain?  No/denies         [facility] Adult PT Treatment/Exercise - 07/03/18 0001      Lumbar Exercises: Stretches   Active Hamstring Stretch  2 reps;30 seconds    Single Knee to Chest Stretch  2 reps;30 seconds    Lower Trunk Rotation  5 reps;10 seconds      Lumbar Exercises: Standing   Functional Squats  10 reps    Functional Squats Limitations  2 sets 1 sumo , 1 parallel     Wall Slides  10 reps    Wall Slides Limitations  small ROM , toe taps x 10       Lumbar Exercises: Supine   Pelvic Tilt  10 reps    Bridge  10 reps      Lumbar Exercises: Sidelying   Hip Abduction  Both;10 reps    Hip Abduction Weights (lbs)  2 sets 1 with sl flexion 1 with sl extension       Lumbar Exercises: Quadruped   Opposite Arm/Leg Raise   Right arm/Left leg;Left arm/Right leg;5 reps    Other Quadruped Lumbar Exercises  childs pose x 3                PT Short Term Goals - 07/03/18 1418      PT SHORT TERM GOAL #1   Title  I with initial HEP ( 06/22/18)     Status  Achieved      PT SHORT TERM GOAL #2   Title  improve bilat hamstring flexibility =/> 60 degrees    Status  On-going        PT Long Term Goals - 07/03/18 1358      PT LONG TERM GOAL #1   Title  Patient will sit for 1 hour in order to sit in classes at school     Baseline  pain increases to 2/10-3/10 each class       PT LONG TERM GOAL #2   Title  pt will be able to perform lower  body dressing without limitations and in normal amount of time    Baseline  better.  Has to do sitting on bed with legs bent (tailor sit)     Status  On-going      PT LONG TERM GOAL #3   Title  I with advanced HEP for core strengthening and LE flexibility to prevent reinjury     Status  On-going      PT LONG TERM GOAL #4   Title  Patient will demsotrate full lumbar flexion without pain in order to pick up her backpack    Status  Unable to assess      PT LONG TERM GOAL #5   Title  improve ODI to -/< 20% limited             Plan - 07/03/18 1418    Clinical Impression Statement  Pt fatigued with basic hip, core, knee exercises.  She does a decent job of controlling knee extension when cued.  Prone hip ex are uncomfortable, quadruped worked a bit better and she noticed a core challenge.  Plans to go to the gym today as well.      PT Treatment/Interventions  Dry needling;Manual techniques;Moist Heat;Traction;Therapeutic activities;Patient/family education;Taping;Therapeutic exercise;Cryotherapy;Electrical Stimulation    PT Next Visit Plan  progress core and leg work, needs to strengthen quads a lot.     PT Home Exercise Plan  bridge.   knees crossed with trunk rot, hamstring stretch , bird dog     Consulted and Agree with Plan of Care  Patient       Patient will  benefit from skilled therapeutic intervention in order to improve the following deficits and impairments:  Pain, Improper body mechanics, Decreased strength, Decreased range of motion  Visit Diagnosis: Acute bilateral low back pain without sciatica  Muscle weakness (generalized)  Muscle spasm of back  Difficulty in walking, not elsewhere classified     Problem List Patient Active Problem List   Diagnosis Date Noted  . Dysmenorrhea 05/27/2016    [name] 07/03/2018, 2:21 PM  [facility] [address] [place], [place], [address] Phone: [phone]   Fax:  [phone]  Name: Patricia Duran MRN: [id number] Date of Birth: 02-28-1996  Karie Mainland, PT 07/03/18 2:21 PM Phone: [phone] Fax: [phone]

## 2018-07-03 NOTE — Patient Instructions (Signed)
Bird Dog Pose - Intermediate    Keep pelvis stable. From table pose, step one leg back to plank pose. Reach opposite arm forward, back foot off floor.  Hold for __2__ breaths. Return arm and leg at same rate. Repeat ___5-10_ times, alternating sides.  Copyright  VHI. All rights reserved.  Do this x 3 per week

## 2018-07-05 ENCOUNTER — Ambulatory Visit: Payer: Medicaid Other | Admitting: Physical Therapy

## 2018-07-05 ENCOUNTER — Encounter: Payer: Self-pay | Admitting: Physical Therapy

## 2018-07-05 DIAGNOSIS — R262 Difficulty in walking, not elsewhere classified: Secondary | ICD-10-CM | POA: Diagnosis not present

## 2018-07-05 DIAGNOSIS — M6281 Muscle weakness (generalized): Secondary | ICD-10-CM

## 2018-07-05 DIAGNOSIS — M62830 Muscle spasm of back: Secondary | ICD-10-CM | POA: Diagnosis not present

## 2018-07-05 DIAGNOSIS — M545 Low back pain, unspecified: Secondary | ICD-10-CM

## 2018-07-05 DIAGNOSIS — Z7689 Persons encountering health services in other specified circumstances: Secondary | ICD-10-CM | POA: Diagnosis not present

## 2018-07-05 NOTE — Therapy (Signed)
[facility] [address] [place], [place], [address] Phone: [phone]   Fax:  [phone]  Physical Therapy Treatment  Patient Details  Name: Patricia Duran MRN: [id number] Date of Birth: April 17, 1996 Referring Provider (PT): Dr Eula Listen   Encounter Date: 07/05/2018  PT End of Session - 07/05/18 1312    Visit Number  6    Number of Visits  12    Date for PT Re-Evaluation  07/20/18    Authorization Type  MCD approved 12 visits    Authorization Time Period  10/29- 12/9     Authorization - Visit Number  5    Authorization - Number of Visits  12    PT Start Time  1312    PT Stop Time  1352    PT Time Calculation (min)  40 min    Activity Tolerance  Patient tolerated treatment well    Behavior During Therapy  [facility] for tasks assessed/performed       Past Medical History:  Diagnosis Date  . Asthma   . Obesity     Past Surgical History:  Procedure Laterality Date  . TOE SURGERY      There were no vitals filed for this visit.  Subjective Assessment - 07/05/18 1312    Subjective  I am sore today in my thighs, back is feeling good.     Patient Stated Goals  move the way she normally moves.     Currently in Pain?  No/denies                       [facility] Adult PT Treatment/Exercise - 07/05/18 0001      Exercises   Exercises  Lumbar      Lumbar Exercises: Stretches   Active Hamstring Stretch  2 reps;30 seconds    Active Hamstring Stretch Limitations  seated EOB    Other Lumbar Stretch Exercise  seated PB roll out    Other Lumbar Stretch Exercise  figure 4 press      Lumbar Exercises: Aerobic   Nustep  5 min L5 LE only      Lumbar Exercises: Machines for Strengthening   Leg Press  2 plates, bilat press & lower; bil press with single lower      Lumbar Exercises: Seated   Long Arc Quad on Chair  Both;10 reps    LAQ on Chair Limitations  with ball bw knees      Lumbar Exercises: Supine    Dead Bug Limitations  cues for pelvic tilt    Bridge Limitations  legs on physioball    Other Supine Lumbar Exercises  oblique rolls on physioball    Other Supine Lumbar Exercises  physioball roll in      Lumbar Exercises: Quadruped   Straight Leg Raises Limitations  leg extended- tap & lift    Opposite Arm/Leg Raise  Right arm/Left leg;Left arm/Right leg;5 reps    Other Quadruped Lumbar Exercises  childs pose               PT Short Term Goals - 07/03/18 1418      PT SHORT TERM GOAL #1   Title  I with initial HEP ( 06/22/18)     Status  Achieved      PT SHORT TERM GOAL #2   Title  improve bilat hamstring flexibility =/> 60 degrees    Status  On-going        PT Long  Term Goals - 07/03/18 1358      PT LONG TERM GOAL #1   Title  Patient will sit for 1 hour in order to sit in classes at school     Baseline  pain increases to 2/10-3/10 each class       PT LONG TERM GOAL #2   Title  pt will be able to perform lower body dressing without limitations and in normal amount of time    Baseline  better.  Has to do sitting on bed with legs bent (tailor sit)     Status  On-going      PT LONG TERM GOAL #3   Title  I with advanced HEP for core strengthening and LE flexibility to prevent reinjury     Status  On-going      PT LONG TERM GOAL #4   Title  Patient will demsotrate full lumbar flexion without pain in order to pick up her backpack    Status  Unable to assess      PT LONG TERM GOAL #5   Title  improve ODI to -/< 20% limited             Plan - 07/05/18 1353    Clinical Impression Statement  Pt tolerated exercises well with reported soreness but no increase in pain. Did not DN due to lack of pain. Asked her to try stretches before getting up after classes and place her feet on something when sitting on the edge of her bed and typing on computer    PT Treatment/Interventions  Dry needling;Manual techniques;Moist Heat;Traction;Therapeutic  activities;Patient/family education;Taping;Therapeutic exercise;Cryotherapy;Electrical Stimulation    PT Next Visit Plan  progress core and leg work, needs to strengthen quads a lot.     PT Home Exercise Plan  bridge.   knees crossed with trunk rot, hamstring stretch , bird dog     Consulted and Agree with Plan of Care  Patient       Patient will benefit from skilled therapeutic intervention in order to improve the following deficits and impairments:  Pain, Improper body mechanics, Decreased strength, Decreased range of motion  Visit Diagnosis: Acute bilateral low back pain without sciatica  Muscle weakness (generalized)     Problem List Patient Active Problem List   Diagnosis Date Noted  . Dysmenorrhea 05/27/2016    Mya Suell C. Lysander Calixte PT, DPT 07/05/18 1:58 PM   [facility] [address] [place], [place], [address] Phone: [phone]   Fax:  [phone]  Name: Patricia Duran MRN: [id number] Date of Birth: January 31, 1996

## 2018-07-10 ENCOUNTER — Ambulatory Visit: Payer: Medicaid Other | Admitting: Physical Therapy

## 2018-07-10 ENCOUNTER — Encounter: Payer: Self-pay | Admitting: Physical Therapy

## 2018-07-10 DIAGNOSIS — M6281 Muscle weakness (generalized): Secondary | ICD-10-CM

## 2018-07-10 DIAGNOSIS — Z7689 Persons encountering health services in other specified circumstances: Secondary | ICD-10-CM | POA: Diagnosis not present

## 2018-07-10 DIAGNOSIS — M62830 Muscle spasm of back: Secondary | ICD-10-CM | POA: Diagnosis not present

## 2018-07-10 DIAGNOSIS — R262 Difficulty in walking, not elsewhere classified: Secondary | ICD-10-CM | POA: Diagnosis not present

## 2018-07-10 DIAGNOSIS — M545 Low back pain: Secondary | ICD-10-CM | POA: Diagnosis not present

## 2018-07-10 NOTE — Therapy (Signed)
[facility] Outpatient Rehabilitation [facility] [address] [place], [place], [address] Phone: [phone]   Fax:  [phone]  Physical Therapy Treatment  Patient Details  Name: Patricia Duran MRN: [id number] Date of Birth: 01/20/1996 Referring Provider (PT): Dr Eula Listen   Encounter Date: 07/10/2018  PT End of Session - 07/10/18 1423    Visit Number  7    Number of Visits  12    Date for PT Re-Evaluation  07/20/18    Authorization Type  MCD approved 12 visits    Authorization Time Period  10/29- 12/9     Authorization - Visit Number  6    Authorization - Number of Visits  12    PT Start Time  1332    PT Stop Time  1415    PT Time Calculation (min)  43 min    Activity Tolerance  Patient tolerated treatment well;No increased pain    Behavior During Therapy  [facility] for tasks assessed/performed       Past Medical History:  Diagnosis Date  . Asthma   . Obesity     Past Surgical History:  Procedure Laterality Date  . TOE SURGERY      There were no vitals filed for this visit.  Subjective Assessment - 07/10/18 1345    Subjective  i did some of the exercises,  they make me sore( qUadriped )  Able to sit 1 hour with pain 3-4/10. PT helps me feel looser when i leave.    Currently in Pain?  Yes    Pain Score  3     Pain Location  Back    Pain Orientation  Lower    Pain Type  Acute pain    Aggravating Factors   sitting ,  lower body dressing.   laying down too long.     Pain Relieving Factors  using chair for school work vs bed.  exercise.                       [facility] Adult PT Treatment/Exercise - 07/10/18 0001      Lumbar Exercises: Stretches   Active Hamstring Stretch  3 reps;30 seconds    Active Hamstring Stretch Limitations  seated EOB    Lower Trunk Rotation  5 reps;10 seconds   legs on green ball   Other Lumbar Stretch Exercise  supine hip/knee flexion stretch ankles on green ball 10 X 3 seconds,  cued for abdominal  engagement      Lumbar Exercises: Machines for Strengthening   Leg Press  2 plates, bilat press & lower; bil press with single lower   also 1 plate initially ,  too easy.     Lumbar Exercises: Standing   Wall Slides  10 reps   5 second hold, challanging   HEP   Wall Slides Limitations  cued to lift toes in shoes,  keep toes in view.        Lumbar Exercises: Seated   LAQ on Chair Limitations  with ball bw knees    Sit to Stand  10 reps      Lumbar Exercises: Supine   Bridge  10 reps   small height   Bridge Limitations  legs on physioball      Lumbar Exercises: Sidelying   Other Sidelying Lumbar Exercises  book opener  5 x each side.      Lumbar Exercises: Quadruped   Opposite Arm/Leg Raise  Right arm/Left leg;Left arm/Right  leg;5 reps   cued initially            PT Education - 07/10/18 1423    Education Details  HEP    Person(s) Educated  Patient    Methods  Explanation;Demonstration;Tactile cues;Verbal cues;Handout    Comprehension  Verbalized understanding;Returned demonstration       PT Short Term Goals - 07/10/18 1737      PT SHORT TERM GOAL #1   Title  I with initial HEP ( 06/22/18)     Time  2    Period  Weeks    Status  Achieved      PT SHORT TERM GOAL #2   Title  improve bilat hamstring flexibility =/> 60 degrees    Baseline  continues to be limited in supine    Time  2    Period  Weeks    Status  On-going        PT Long Term Goals - 07/10/18 1738      PT LONG TERM GOAL #1   Title  Patient will sit for 1 hour in order to sit in classes at school     Baseline  pain increases to 2/10-3/10 each class ,  can sit 1 hour    Time  6    Period  Weeks    Status  On-going      PT LONG TERM GOAL #2   Title  pt will be able to perform lower body dressing without limitations and in normal amount of time    Baseline  improving, continues to be challanging    Time  6    Period  Weeks    Status  On-going      PT LONG TERM GOAL #3   Title  I with  advanced HEP for core strengthening and LE flexibility to prevent reinjury     Baseline  progressed HEp today for this    Time  6    Period  Weeks    Status  On-going      PT LONG TERM GOAL #4   Title  Patient will demsotrate full lumbar flexion without pain in order to pick up her backpack    Time  6    Period  Weeks    Status  Unable to assess      PT LONG TERM GOAL #5   Title  improve ODI to -/< 20% limited     Time  6    Period  Weeks    Status  Unable to assess            Plan - 07/10/18 1735    Clinical Impression Statement  Patient able to progress HEp today.  Sitting tolerance up to 1 hour now with 3-4 / 10 pain.  Progress toward sitting goal.  She has returned to gym  for part of her routine.  cues needed for teniques in gym.    PT Next Visit Plan  progress core and leg work, needs to strengthen quads a lot. review gym program    PT Home Exercise Plan  bridge.   knees crossed with trunk rot, hamstring stretch , bird dog ,  wall sit    Consulted and Agree with Plan of Care  Patient       Patient will benefit from skilled therapeutic intervention in order to improve the following deficits and impairments:     Visit Diagnosis: Muscle weakness (generalized)  Muscle spasm of back  Difficulty  in walking, not elsewhere classified     Problem List Patient Active Problem List   Diagnosis Date Noted  . Dysmenorrhea 05/27/2016    , PTA 07/10/2018, 5:41 PM  [facility] [address] [place], [place], [address] Phone: [phone]   Fax:  [phone]  Name: Arlester MarkerJazmine N Bicking MRN: [id number] Date of Birth: 1996-06-18

## 2018-07-10 NOTE — Patient Instructions (Signed)
Wall Sit: Half (Active)    Back against wall, slide down so knees are halfway to parallel with floor. Hold __5-10_ seconds. Rise to an erect position. Use 0___ lbs. Complete __1_ sets of _10__ repetitions. Perform _1__ sessions per day.  3- 4 x a week.   http://gtsc.exer.us/443   Copyright  VHI. All rights reserved.

## 2018-07-12 ENCOUNTER — Ambulatory Visit: Payer: Medicaid Other | Admitting: Physical Therapy

## 2018-07-12 ENCOUNTER — Encounter: Payer: Self-pay | Admitting: Physical Therapy

## 2018-07-12 DIAGNOSIS — M545 Low back pain, unspecified: Secondary | ICD-10-CM

## 2018-07-12 DIAGNOSIS — R262 Difficulty in walking, not elsewhere classified: Secondary | ICD-10-CM

## 2018-07-12 DIAGNOSIS — M6281 Muscle weakness (generalized): Secondary | ICD-10-CM | POA: Diagnosis not present

## 2018-07-12 DIAGNOSIS — M62830 Muscle spasm of back: Secondary | ICD-10-CM

## 2018-07-12 NOTE — Therapy (Signed)
[facility] Outpatient Rehabilitation [facility] [address] [place], [place], [address] Phone: [phone]   Fax:  [phone]  Physical Therapy Treatment  Patient Details  Name: Patricia Duran MRN: [id number] Date of Birth: Dec 16, 1995 Referring Provider (PT): Dr Eula Listen   Encounter Date: 07/12/2018  PT End of Session - 07/12/18 1416    Visit Number  8    Number of Visits  12    Date for PT Re-Evaluation  07/20/18    Authorization Type  MCD approved 12 visits    Authorization Time Period  10/29- 12/9     Authorization - Visit Number  7    Authorization - Number of Visits  12    PT Start Time  1331    PT Stop Time  1428    PT Time Calculation (min)  57 min    Activity Tolerance  Patient tolerated treatment well    Behavior During Therapy  [facility] for tasks assessed/performed       Past Medical History:  Diagnosis Date  . Asthma   . Obesity     Past Surgical History:  Procedure Laterality Date  . TOE SURGERY      There were no vitals filed for this visit.  Subjective Assessment - 07/12/18 1336    Subjective  I did not get sore after last session.  I felt pressure in back after arriving and sitting longer.  Pain less today.    Currently in Pain?  Yes    Pain Score  2     Pain Location  Back                       [facility] Adult PT Treatment/Exercise - 07/12/18 0001      Lumbar Exercises: Stretches   Active Hamstring Stretch  3 reps;30 seconds    Active Hamstring Stretch Limitations  seated EOB    Lower Trunk Rotation  5 reps;10 seconds   legs on green ball   Other Lumbar Stretch Exercise  figure 4 press      Lumbar Exercises: Aerobic   Nustep  5 minutes,  L%      Lumbar Exercises: Machines for Strengthening   Leg Press  2 plates both and single leg,  3 plates both      Lumbar Exercises: Standing   Wall Slides  10 reps      Lumbar Exercises: Supine   Bridge  10 reps    Other Supine Lumbar Exercises  bridge with  clams 10 x cued initially.     Other Supine Lumbar Exercises  physioball roll in      Knee/Hip Exercises: Machines for Strengthening   Cybex Knee Extension  10 X 20 LBS X 10 ,  cues    Cybex Knee Flexion  10 X 20 LBS cued      Modalities   Modalities  Moist Heat      Moist Heat Therapy   Number Minutes Moist Heat  10 Minutes    Moist Heat Location  Lumbar Spine             PT Education - 07/12/18 1416    Education Details  exercise form    Person(s) Educated  Patient    Methods  Explanation;Demonstration;Verbal cues    Comprehension  Returned demonstration;Verbalized understanding       PT Short Term Goals - 07/10/18 1737      PT SHORT TERM GOAL #1   Title  I with initial HEP ( 06/22/18)     Time  2    Period  Weeks    Status  Achieved      PT SHORT TERM GOAL #2   Title  improve bilat hamstring flexibility =/> 60 degrees    Baseline  continues to be limited in supine    Time  2    Period  Weeks    Status  On-going        PT Long Term Goals - 07/10/18 1738      PT LONG TERM GOAL #1   Title  Patient will sit for 1 hour in order to sit in classes at school     Baseline  pain increases to 2/10-3/10 each class ,  can sit 1 hour    Time  6    Period  Weeks    Status  On-going      PT LONG TERM GOAL #2   Title  pt will be able to perform lower body dressing without limitations and in normal amount of time    Baseline  improving, continues to be challanging    Time  6    Period  Weeks    Status  On-going      PT LONG TERM GOAL #3   Title  I with advanced HEP for core strengthening and LE flexibility to prevent reinjury     Baseline  progressed HEp today for this    Time  6    Period  Weeks    Status  On-going      PT LONG TERM GOAL #4   Title  Patient will demsotrate full lumbar flexion without pain in order to pick up her backpack    Time  6    Period  Weeks    Status  Unable to assess      PT LONG TERM GOAL #5   Title  improve ODI to -/< 20%  limited     Time  6    Period  Weeks    Status  Unable to assess            Plan - 07/12/18 1417    Clinical Impression Statement  Pain gradually improving.  She was able to increase difficulty of exercises today without pain increased.  No new goals met.     PT Next Visit Plan  progress core and leg work, needs to strengthen quads a lot.     PT Home Exercise Plan  bridge.   knees crossed with trunk rot, hamstring stretch , bird dog ,  wall sit    Consulted and Agree with Plan of Care  Patient       Patient will benefit from skilled therapeutic intervention in order to improve the following deficits and impairments:     Visit Diagnosis: Muscle weakness (generalized)  Muscle spasm of back  Difficulty in walking, not elsewhere classified  Acute bilateral low back pain without sciatica     Problem List Patient Active Problem List   Diagnosis Date Noted  . Dysmenorrhea 05/27/2016    Shajuan Musso PTA 07/12/2018, 2:18 PM  [facility] [address] [place], [place], [address] Phone: [phone]   Fax:  [phone]  Name: SHARENE PINKHAM MRN: [id number] Date of Birth: [birth year]

## 2018-07-17 ENCOUNTER — Ambulatory Visit: Payer: Medicaid Other | Admitting: Physical Therapy

## 2018-07-17 ENCOUNTER — Encounter: Payer: Self-pay | Admitting: Physical Therapy

## 2018-07-17 DIAGNOSIS — M545 Low back pain, unspecified: Secondary | ICD-10-CM

## 2018-07-17 DIAGNOSIS — R262 Difficulty in walking, not elsewhere classified: Secondary | ICD-10-CM | POA: Diagnosis not present

## 2018-07-17 DIAGNOSIS — M6281 Muscle weakness (generalized): Secondary | ICD-10-CM | POA: Diagnosis not present

## 2018-07-17 DIAGNOSIS — Z7689 Persons encountering health services in other specified circumstances: Secondary | ICD-10-CM | POA: Diagnosis not present

## 2018-07-17 DIAGNOSIS — M62830 Muscle spasm of back: Secondary | ICD-10-CM | POA: Diagnosis not present

## 2018-07-17 NOTE — Therapy (Signed)
[place], [place], [address] Phone: [phone]   Fax:  [phone]  Physical Therapy Treatment  Patient Details  Name: TIANE SZYDLOWSKI MRN: [id number] Date of Birth: 1996-05-11 Referring Provider (PT): Dr Rhina Brackett   Encounter Date: 07/17/2018  PT End of Session - 07/17/18 1305    Visit Number  9    Number of Visits  12    Date for PT Re-Evaluation  07/20/18    Authorization Type  MCD approved 12 visits    Authorization Time Period  10/29- 12/9     PT Start Time  1305   on heat at end of session   PT Stop Time  1352    PT Time Calculation (min)  47 min    Activity Tolerance  Patient limited by fatigue       Past Medical History:  Diagnosis Date  . Asthma   . Obesity     Past Surgical History:  Procedure Laterality Date  . TOE SURGERY      There were no vitals filed for this visit.  Subjective Assessment - 07/17/18 1306    Subjective  Pt reports that she is doing well, no pain.  Will be in the car for 1-2 hrs this weekend and is worried about how she will do.      Patient Stated Goals  move the way she normally moves.     Currently in Pain?  No/denies         [facility] PT Assessment - 07/17/18 0001      Assessment   Medical Diagnosis  Lumbosacral strain    Referring Provider (PT)  Dr Rhina Brackett    Onset Date/Surgical Date  05/25/18      Observation/Other Assessments   Oswestry Disability Index   20% limited      AROM   Lumbar Flexion  lower shin    Lumbar Extension  WNL    Lumbar - Right Rotation  WNL    Lumbar - Left Rotation  WNL      Flexibility   Hamstrings  supine SLR Rt 64, Lt 52                   [facility] Adult PT Treatment/Exercise - 07/17/18 0001      Exercises   Exercises  Lumbar      Lumbar Exercises: Stretches   Passive Hamstring Stretch  Left;Right;30 seconds   supine with strap   Quad Stretch  Left;Right;30 seconds   prone with  strap   Piriformis Stretch  Left;Right;30 seconds      Lumbar Exercises: Aerobic   Nustep  L7x5' LE only      Lumbar Exercises: Seated   Other Seated Lumbar Exercises  on blue foam - FWD reach holding 2 5# wts, 10 reps opposite arm/leg lifts, BWD leans      Lumbar Exercises: Supine   Bridge  20 reps   while performing lat pull downs holding 2 5# wts   Other Supine Lumbar Exercises  table top wth heel taps x 10 each side - very difficult for pt    Other Supine Lumbar Exercises  LTR using abdominals to bring knees up       Lumbar Exercises: Prone   Opposite Arm/Leg Raise  Left arm/Right leg;Right arm/Left leg;20 reps    Other Prone Lumbar Exercises  10reps hip ext with knee flex, then upper body lifts      Modalities  Modalities  Moist Heat      Moist Heat Therapy   Number Minutes Moist Heat  10 Minutes    Moist Heat Location  Lumbar Spine               PT Short Term Goals - 07/17/18 1308      PT SHORT TERM GOAL #1   Title  I with initial HEP ( 06/22/18)     Status  Achieved      PT SHORT TERM GOAL #2   Title  improve bilat hamstring flexibility =/> 60 degrees        PT Long Term Goals - 07/17/18 1309      PT LONG TERM GOAL #1   Title  Patient will sit for 1 hour in order to sit in classes at school     Baseline  pain increases to 2/10-3/10 each class ,  can sit 1 hour    Status  Partially Met      PT LONG TERM GOAL #2   Title  pt will be able to perform lower body dressing without limitations and in normal amount of time    Baseline  still having stiffness first thing in the AM     Status  On-going      PT LONG TERM GOAL #3   Title  I with advanced HEP for core strengthening and LE flexibility to prevent reinjury     Status  On-going      PT LONG TERM GOAL #4   Title  Patient will demsotrate full lumbar flexion without pain in order to pick up her backpack    Status  Partially Met   able to pick up backpack , almost full lumbar flexion     PT LONG  TERM GOAL #5   Title  improve ODI to -/< 20% limited     Status  Achieved   205 limited           Plan - 07/17/18 1341    Clinical Impression Statement  Leeona is making progress to her goals, partially met them.  She is still very weak in her core and has difficulty maintaining a neutral spine when performing core work.  She will be taking a long car ride this weekend and will see how she does.   Should be ready for discharge to HEP soon.    Rehab Potential  Good    PT Frequency  2x / week    PT Duration  6 weeks    PT Treatment/Interventions  Dry needling;Manual techniques;Moist Heat;Traction;Therapeutic activities;Patient/family education;Taping;Therapeutic exercise;Cryotherapy;Electrical Stimulation    PT Next Visit Plan  progress core and leg work, needs to strengthen quads a lot.     Consulted and Agree with Plan of Care  Patient       Patient will benefit from skilled therapeutic intervention in order to improve the following deficits and impairments:  Pain, Improper body mechanics, Decreased strength, Decreased range of motion  Visit Diagnosis: Muscle weakness (generalized)  Muscle spasm of back  Difficulty in walking, not elsewhere classified  Acute bilateral low back pain without sciatica     Problem List Patient Active Problem List   Diagnosis Date Noted  . Dysmenorrhea 05/27/2016    Jeral Pinch PT 07/17/2018, 1:45 PM  [facility] [address] [place], [place], [address] Phone: [phone]   Fax:  [phone]  Name: LOVINA ZUVER MRN: [id number] Date of Birth: [birth year]

## 2018-07-18 DIAGNOSIS — Z113 Encounter for screening for infections with a predominantly sexual mode of transmission: Secondary | ICD-10-CM | POA: Diagnosis not present

## 2018-07-26 ENCOUNTER — Ambulatory Visit: Payer: Medicaid Other | Attending: Family Medicine

## 2018-07-26 DIAGNOSIS — R262 Difficulty in walking, not elsewhere classified: Secondary | ICD-10-CM | POA: Diagnosis not present

## 2018-07-26 DIAGNOSIS — M62830 Muscle spasm of back: Secondary | ICD-10-CM | POA: Diagnosis not present

## 2018-07-26 DIAGNOSIS — M6281 Muscle weakness (generalized): Secondary | ICD-10-CM | POA: Diagnosis not present

## 2018-07-26 DIAGNOSIS — M545 Low back pain: Secondary | ICD-10-CM | POA: Insufficient documentation

## 2018-07-26 NOTE — Patient Instructions (Addendum)
Trunk Rotation    Stand with feet shoulder width apart, hands together, knees slightly bent. Rotate shoulders and arms  Across chest, pelvis held in neutral and facing forward. Return to center and rotate to other side. Repeat sequence __10-15__ times per session. Do __3-4__ sessions per week.  Copyright  VHI. All rights reserved.  (Clinic) Combined: Diagonal Lift    . Sitting with band under foot , pulling arms up and back in opposite direction over shoulder. Repeat _10-15___ times per set. Do __1__ sets per session. Do __3-4__ sessions per week. Use ____ green band.     RT/LT .  Copyright  VHI. All rights reserved.  V Chop    Hold green band anchored under foot . move arms in V shape: up and out, then down, then up and out on other side. Repeat _10-15_ times per set. Rest __ seconds after set. Do _1_ sets per session.  Copyright  VHI. All rights reserved.  Also bent leg raise   10-15 reps 1x/day  RT and LT    Use abdominals to facilitate lift

## 2018-07-26 NOTE — Therapy (Signed)
[place], [place], [address] Phone: [phone]   Fax:  [phone]  Physical Therapy Treatment  Patient Details  Name: Patricia Duran MRN: [id number] Date of Birth: 05/27/1996 Referring Provider (PT): Dr Rhina Brackett   Encounter Date: 07/26/2018  PT End of Session - 07/26/18 1245    Visit Number  10    Number of Visits  15    Date for PT Re-Evaluation  08/18/18    Authorization Type  MCD approved 12 visits    Authorization Time Period  10/29- 12/9     Authorization - Visit Number  8    Authorization - Number of Visits  12    PT Start Time  6962    PT Stop Time  1325    PT Time Calculation (min)  40 min    Activity Tolerance  Patient limited by fatigue    Behavior During Therapy  [facility] for tasks assessed/performed       Past Medical History:  Diagnosis Date  . Asthma   . Obesity     Past Surgical History:  Procedure Laterality Date  . TOE SURGERY      There were no vitals filed for this visit.  Subjective Assessment - 07/26/18 1255    Subjective  No pain . doing well.     Pain Score  0-No pain                       [facility] Adult PT Treatment/Exercise - 07/26/18 0001      Exercises   Exercises  Lumbar      Lumbar Exercises: Aerobic   Nustep  L 5 x6 min LE only      Lumbar Exercises: Standing   Other Standing Lumbar Exercises  standing on ble foam with 5# weight lifts forward to 90 degrees  then 10 reps RT and LT individually then lateral pulls RT/LT green band and face with shoulder ext x 10 reps      Lumbar Exercises: Seated   Other Seated Lumbar Exercises  ift in diagonals green band for HEP and rotation  RT andLT x 10      Lumbar Exercises: Supine   Bridge  20 reps    Other Supine Lumbar Exercises  table top wth single leg raise and lower  cue to engage abs x 10 each side -     Other Supine Lumbar Exercises  LTR using abdominals to bring knees up x10 RT  /LT each      Lumbar Exercises: Prone   Opposite Arm/Leg Raise  --    Other Prone Lumbar Exercises  --             PT Education - 07/26/18 1341    Education Details  HEP    Person(s) Educated  Patient    Methods  Explanation;Demonstration;Tactile cues;Verbal cues;Handout    Comprehension  Returned demonstration;Verbalized understanding       PT Short Term Goals - 07/17/18 1308      PT SHORT TERM GOAL #1   Title  I with initial HEP ( 06/22/18)     Status  Achieved      PT SHORT TERM GOAL #2   Title  improve bilat hamstring flexibility =/> 60 degrees        PT Long Term Goals - 07/26/18 1343      PT LONG TERM GOAL #1   Title  Patient will sit for  1 hour in order to sit in classes at school     Status  Partially Met      PT [place] #2   Title  pt will be able to perform lower body dressing without limitations and in normal amount of time    Baseline  still having stiffness first thing in the AM     Status  Partially Met      PT LONG TERM GOAL #3   Title  I with advanced HEP for core strengthening and LE flexibility to prevent reinjury     Baseline  progressed HEp today for this    Status  On-going      PT LONG TERM GOAL #4   Title  Patient will demsotrate full lumbar flexion without pain in order to pick up her backpack    Status  Unable to assess      PT LONG TERM GOAL #5   Title  improve ODI to -/< 20% limited     Status  Achieved            Plan - 07/26/18 1308    Clinical Impression Statement  Will plan on finiching visits scheduled then discharge but she was extended if primary PT wants to extend.   She uis doign very well with no pain with activity. exercises.    PT Frequency  --   2-3 visits to finish POC and decide on extension   PT Treatment/Interventions  Dry needling;Manual techniques;Moist Heat;Traction;Therapeutic activities;Patient/family education;Taping;Therapeutic exercise;Cryotherapy;Electrical Stimulation    PT Next Visit  Plan  progress core and leg work, Engineer, mining up HEP probable discharge end of this auth visits per Medicaid    PT Home Exercise Plan  bridge.   knees crossed with trunk rot, hamstring stretch , bird dog ,  wall sit, bent leg raise single leg,  single leg clam in supine RT/LT, diagonal lift.  and rotation with band trunk stable     Consulted and Agree with Plan of Care  Patient       Patient will benefit from skilled therapeutic intervention in order to improve the following deficits and impairments:  Pain, Improper body mechanics, Decreased strength, Decreased range of motion  Visit Diagnosis: Muscle weakness (generalized)  Muscle spasm of back     Problem List Patient Active Problem List   Diagnosis Date Noted  . Dysmenorrhea 05/27/2016    Darrel Hoover  PT 07/26/2018, 1:48 PM  [facility] [address] [place], [place], [address] Phone: [phone]   Fax:  [phone]  Name: Patricia Duran MRN: [id number] Date of Birth: 1995/12/12

## 2018-07-27 ENCOUNTER — Ambulatory Visit: Payer: Medicaid Other | Admitting: Physical Therapy

## 2018-07-27 ENCOUNTER — Encounter: Payer: Self-pay | Admitting: Physical Therapy

## 2018-07-27 DIAGNOSIS — Z7689 Persons encountering health services in other specified circumstances: Secondary | ICD-10-CM | POA: Diagnosis not present

## 2018-07-27 DIAGNOSIS — M545 Low back pain, unspecified: Secondary | ICD-10-CM

## 2018-07-27 DIAGNOSIS — M62830 Muscle spasm of back: Secondary | ICD-10-CM | POA: Diagnosis not present

## 2018-07-27 DIAGNOSIS — M6281 Muscle weakness (generalized): Secondary | ICD-10-CM | POA: Diagnosis not present

## 2018-07-27 DIAGNOSIS — R262 Difficulty in walking, not elsewhere classified: Secondary | ICD-10-CM | POA: Diagnosis not present

## 2018-07-27 NOTE — Therapy (Signed)
[place], [place], [address] Phone: [phone]   Fax:  [phone]  Physical Therapy Treatment  Patient Details  Name: Patricia Duran MRN: [id number] Date of Birth: 09-Mar-1996 Referring Provider (PT): Dr Rhina Brackett   Encounter Date: 07/27/2018  PT End of Session - 07/27/18 1329    Visit Number  11    Number of Visits  15    Date for PT Re-Evaluation  08/18/18    Authorization Type  MCD approved 12 visits    Authorization Time Period  10/29- 12/9     Authorization - Visit Number  9    PT Start Time  1326    PT Stop Time  1414    PT Time Calculation (min)  48 min    Activity Tolerance  Patient tolerated treatment well    Behavior During Therapy  [facility] for tasks assessed/performed       Past Medical History:  Diagnosis Date  . Asthma   . Obesity     Past Surgical History:  Procedure Laterality Date  . TOE SURGERY      There were no vitals filed for this visit.  Subjective Assessment - 07/27/18 1327    Subjective  Haven't been back to the gym this week.  He gave me some exercises too.     Currently in Pain?  No/denies    Pain Onset  More than a month ago    Pain Frequency  Rarely          [facility] Adult PT Treatment/Exercise - 07/27/18 0001      Lumbar Exercises: Stretches   Active Hamstring Stretch  3 reps;30 seconds    Lower Trunk Rotation  10 seconds    Lower Trunk Rotation Limitations  x 10 , then knees wide for ant hip stretch     Piriformis Stretch  Left;Right;30 seconds      Lumbar Exercises: Aerobic   Nustep  L 5 x6 min LE only      Lumbar Exercises: Standing   Functional Squats  10 reps    Functional Squats Limitations  10 lbs     Lifting Weights (lbs)  standing oblique lift 10 lbs small ROM x 10     Row  Strengthening;20 reps    Theraband Level (Row)  Level 3 (Green)    Shoulder Extension  Strengthening;Both;20 reps;Theraband    Theraband Level (Shoulder  Extension)  Level 3 (Green)    Other Standing Lumbar Exercises  shoulder press 5 lbs, diagonal pull green x 10 on foam     Other Standing Lumbar Exercises  single leg hip hinge x 10 and hip ext holding counter top .  focus on pelvis stable.       Lumbar Exercises: Supine   Basic Lumbar Stabilization Limitations  added UE alternating arm lifts and horizontal abd green     Basic Lumbar Stabilization  10 reps    Advanced Lumbar Stabilization Limitations  done on foam roller: clam, march and SLR      Lumbar Exercises: Quadruped   Plank  hold countertop     Other Quadruped Lumbar Exercises  mountain climbers hands on foam pad at countertop x 10                PT Short Term Goals - 07/17/18 1308      PT SHORT TERM GOAL #1   Title  I with initial HEP ( 06/22/18)     Status  Achieved      PT SHORT TERM GOAL #2   Title  improve bilat hamstring flexibility =/> 60 degrees        PT Long Term Goals - 07/26/18 1343      PT LONG TERM GOAL #1   Title  Patient will sit for 1 hour in order to sit in classes at school     Status  Partially Met      PT [place] #2   Title  pt will be able to perform lower body dressing without limitations and in normal amount of time    Baseline  still having stiffness first thing in the AM     Status  Partially Met      PT LONG TERM GOAL #3   Title  I with advanced HEP for core strengthening and LE flexibility to prevent reinjury     Baseline  progressed HEp today for this    Status  On-going      PT LONG TERM GOAL #4   Title  Patient will demsotrate full lumbar flexion without pain in order to pick up her backpack    Status  Unable to assess      PT LONG TERM GOAL #5   Title  improve ODI to -/< 20% limited     Status  Achieved            Plan - 07/27/18 1330    Clinical Impression Statement  No pain during session.  Needs cues to avoid knee hyperextension in standing and engage core.     PT Treatment/Interventions  Dry  needling;Manual techniques;Moist Heat;Traction;Therapeutic activities;Patient/family education;Taping;Therapeutic exercise;Cryotherapy;Electrical Stimulation    PT Next Visit Plan  last visit, DC, progress core and leg work, Engineer, mining up HEP probable discharge end of this auth visits per Medicaid    PT Home Exercise Plan  bridge.   knees crossed with trunk rot, hamstring stretch , bird dog ,  wall sit, bent leg raise single leg,  single leg clam in supine RT/LT, diagonal lift.  and rotation with band trunk stable     Consulted and Agree with Plan of Care  Patient       Patient will benefit from skilled therapeutic intervention in order to improve the following deficits and impairments:  Pain, Improper body mechanics, Decreased strength, Decreased range of motion  Visit Diagnosis: Muscle weakness (generalized)  Muscle spasm of back  Difficulty in walking, not elsewhere classified  Acute bilateral low back pain without sciatica     Problem List Patient Active Problem List   Diagnosis Date Noted  . Dysmenorrhea 05/27/2016    PAA,JENNIFER 07/27/2018, 2:18 PM  [facility] [address] [place], [place], [address] Phone: [phone]   Fax:  [phone]  Name: Patricia Duran MRN: [id number] Date of Birth: 1996-08-19  Raeford Razor, PT 07/27/18 2:18 PM Phone: [phone] Fax: [phone]

## 2018-07-31 ENCOUNTER — Ambulatory Visit: Payer: Medicaid Other | Admitting: Physical Therapy

## 2018-07-31 ENCOUNTER — Encounter: Payer: Self-pay | Admitting: Physical Therapy

## 2018-07-31 DIAGNOSIS — R262 Difficulty in walking, not elsewhere classified: Secondary | ICD-10-CM

## 2018-07-31 DIAGNOSIS — Z7689 Persons encountering health services in other specified circumstances: Secondary | ICD-10-CM | POA: Diagnosis not present

## 2018-07-31 DIAGNOSIS — M6281 Muscle weakness (generalized): Secondary | ICD-10-CM | POA: Diagnosis not present

## 2018-07-31 DIAGNOSIS — M62830 Muscle spasm of back: Secondary | ICD-10-CM | POA: Diagnosis not present

## 2018-07-31 DIAGNOSIS — M545 Low back pain, unspecified: Secondary | ICD-10-CM

## 2018-08-01 NOTE — Therapy (Signed)
[facility] [address] [place], [place], [address] Phone: [phone]   Fax:  [phone]  Physical Therapy Treatment/Discharge  Patient Details  Name: Patricia Duran MRN: [id number] Date of Birth: 05/03/1996 Referring Provider (PT): Dr Eula Listen   Encounter Date: 07/31/2018  PT End of Session - 07/31/18 1412    Visit Number  12    Number of Visits  15    Date for PT Re-Evaluation  08/18/18    Authorization Type  MCD approved 12 visits    Authorization - Visit Number  10    Authorization - Number of Visits  12    PT Start Time  1330    PT Stop Time  1410    PT Time Calculation (min)  40 min    Activity Tolerance  Patient tolerated treatment well    Behavior During Therapy  [facility] for tasks assessed/performed       Past Medical History:  Diagnosis Date  . Asthma   . Obesity     Past Surgical History:  Procedure Laterality Date  . TOE SURGERY      There were no vitals filed for this visit.  Subjective Assessment - 07/31/18 1336    Subjective  Everything is pretty good. My back hurts sometimes but nothing bad.     Patient Stated Goals  move the way she normally moves.     Currently in Pain?  No/denies         [facility] PT Assessment - 08/01/18 0001      Assessment   Medical Diagnosis  Lumbosacral strain    Referring Provider (PT)  Dr Eula Listen    Onset Date/Surgical Date  05/25/18      Flexibility   Hamstrings  Rt 75, Lt 70                   [facility] Adult PT Treatment/Exercise - 08/01/18 0001      Exercises   Exercises  --   see HEP list     Lumbar Exercises: Aerobic   Recumbent Bike  5 min variable levels   3 min on eliptical            PT Education - 07/31/18 1411    Education Details  goals discussion, gym, balance daily exericses    Person(s) Educated  Patient    Methods  Explanation;Demonstration;Tactile cues;Verbal cues;Handout    Comprehension  Verbalized  understanding;Returned demonstration;Verbal cues required;Tactile cues required       PT Short Term Goals - 07/17/18 1308      PT SHORT TERM GOAL #1   Title  I with initial HEP ( 06/22/18)     Status  Achieved      PT SHORT TERM GOAL #2   Title  improve bilat hamstring flexibility =/> 60 degrees        PT Long Term Goals - 07/31/18 1337      PT LONG TERM GOAL #1   Title  Patient will sit for 1 hour in order to sit in classes at school     Baseline  for the most part able    Status  Achieved      PT LONG TERM GOAL #2   Title  pt will be able to perform lower body dressing without limitations and in normal amount of time    Baseline  able    Status  Achieved      PT LONG TERM GOAL #3  Title  I with advanced HEP for core strengthening and LE flexibility to prevent reinjury     Baseline  doing HEP    Status  Achieved      PT LONG TERM GOAL #4   Title  Patient will demsotrate full lumbar flexion without pain in order to pick up her backpack    Baseline  able    Status  Achieved      PT LONG TERM GOAL #5   Title  improve ODI to -/< 20% limited     Status  Achieved            Plan - 07/31/18 1342    Clinical Impression Statement  Pt has met all of her goals at this time and is d/c to independent gym program. Pt was able to demo good form with HEP and verbalized proper muscle contraction. Was encouraged to contact us with any further questions.     PT Treatment/Interventions  Dry needling;Manual techniques;Moist Heat;Traction;Therapeutic activities;Patient/family education;Taping;Therapeutic exercise;Cryotherapy;Electrical Stimulation    PT Home Exercise Plan  bridge.   knees crossed with trunk rot, hamstring stretch , bird dog ,  wall sit, bent leg raise single leg,  sidelying clam, diagonal lift.  and rotation with band trunk stable     Consulted and Agree with Plan of Care  Patient       Patient will benefit from skilled therapeutic intervention in order to  improve the following deficits and impairments:  Pain, Improper body mechanics, Decreased strength, Decreased range of motion  Visit Diagnosis: Muscle weakness (generalized)  Muscle spasm of back  Difficulty in walking, not elsewhere classified  Acute bilateral low back pain without sciatica     Problem List Patient Active Problem List   Diagnosis Date Noted  . Dysmenorrhea 05/27/2016    PHYSICAL THERAPY DISCHARGE SUMMARY  Visits from Start of Care: 12  Current functional level related to goals / functional outcomes: See above   Remaining deficits: See above   Education / Equipment: Anatomy of condition, POC, HEP, exercise form/rationale  Plan: Patient agrees to discharge.  Patient goals were met. Patient is being discharged due to meeting the stated rehab goals.  ?????     Summit Borchardt C. Alichia Alridge PT, DPT 08/01/18 1:08 PM   [facility] [address] [place], [place], [address] Phone: [phone]   Fax:  [phone]  Name: Patricia Duran MRN: [id number] Date of Birth: November 14, 1995

## 2018-08-29 ENCOUNTER — Other Ambulatory Visit: Payer: Self-pay

## 2018-08-29 DIAGNOSIS — N946 Dysmenorrhea, unspecified: Secondary | ICD-10-CM

## 2018-08-29 MED ORDER — MEDROXYPROGESTERONE ACETATE 150 MG/ML IM SUSP: 150.0000 mg | INTRAMUSCULAR | 4 refills | Status: DC

## 2018-08-30 ENCOUNTER — Encounter: Payer: Self-pay | Admitting: Obstetrics

## 2018-08-30 ENCOUNTER — Other Ambulatory Visit: Payer: Self-pay

## 2018-08-30 ENCOUNTER — Other Ambulatory Visit (HOSPITAL_COMMUNITY)
Admission: RE | Admit: 2018-08-30 | Discharge: 2018-08-30 | Disposition: A | Discharge status: Home / Self Care | Payer: Medicaid Other | Source: Ambulatory Visit | Attending: Obstetrics | Admitting: Obstetrics

## 2018-08-30 ENCOUNTER — Ambulatory Visit (INDEPENDENT_AMBULATORY_CARE_PROVIDER_SITE_OTHER): Payer: Medicaid Other | Admitting: Obstetrics

## 2018-08-30 VITALS — BP 117/77 | HR 80 | Ht 69.0 in | Wt 362.6 lb

## 2018-08-30 DIAGNOSIS — Z6841 Body Mass Index (BMI) 40.0 and over, adult: Secondary | ICD-10-CM

## 2018-08-30 DIAGNOSIS — Z8619 Personal history of other infectious and parasitic diseases: Secondary | ICD-10-CM

## 2018-08-30 DIAGNOSIS — Z3042 Encounter for surveillance of injectable contraceptive: Secondary | ICD-10-CM

## 2018-08-30 DIAGNOSIS — Z01419 Encounter for gynecological examination (general) (routine) without abnormal findings: Secondary | ICD-10-CM

## 2018-08-30 DIAGNOSIS — Z0000 Encounter for general adult medical examination without abnormal findings: Secondary | ICD-10-CM | POA: Diagnosis not present

## 2018-08-30 DIAGNOSIS — Z113 Encounter for screening for infections with a predominantly sexual mode of transmission: Secondary | ICD-10-CM

## 2018-08-30 DIAGNOSIS — N898 Other specified noninflammatory disorders of vagina: Secondary | ICD-10-CM | POA: Diagnosis not present

## 2018-08-30 MED ORDER — MEDROXYPROGESTERONE ACETATE 150 MG/ML IM SUSP
150.0000 mg | Freq: Once | INTRAMUSCULAR | Status: AC
  Administered 2018-08-30: 150 mg via INTRAMUSCULAR

## 2018-08-30 NOTE — Progress Notes (Signed)
Subjective:        Patricia Duran is a 23 y.o. female here for a routine exam.  Current complaints: None.    Personal health questionnaire:  Is patient Ashkenazi Jewish, have a family history of breast and/or ovarian cancer: no Is there a family history of uterine cancer diagnosed at age < 63, gastrointestinal cancer, urinary tract cancer, family member who is a Personnel officer syndrome-associated carrier: no Is the patient overweight and hypertensive, family history of diabetes, personal history of gestational diabetes, preeclampsia or PCOS: no Is patient over 12, have PCOS,  family history of premature CHD under age 7, diabetes, smoke, have hypertension or peripheral artery disease:  no At any time, has a partner hit, kicked or otherwise hurt or frightened you?: no Over the past 2 weeks, have you felt down, depressed or hopeless?: no Over the past 2 weeks, have you felt little interest or pleasure in doing things?:no   Gynecologic History No LMP recorded. Patient has had an injection. Contraception: Depo-Provera injections Last Pap: n/a. Results were: n/a Last mammogram: n/a. Results were: n/a  Obstetric History OB History  Gravida Para Term Preterm AB Living  0 0 0 0 0 0  SAB TAB Ectopic Multiple Live Births  0 0 0 0 0    Past Medical History:  Diagnosis Date  . Asthma   . Obesity     Past Surgical History:  Procedure Laterality Date  . TOE SURGERY       Current Outpatient Medications:  .  albuterol (PROVENTIL HFA;VENTOLIN HFA) 108 (90 BASE) MCG/ACT inhaler, Inhale 1 puff into the lungs every 6 (six) hours as needed for wheezing or shortness of breath., Disp: , Rfl:  .  cyclobenzaprine (FLEXERIL) 10 MG tablet, Take 1 tablet (10 mg total) by mouth 2 (two) times daily as needed for muscle spasms. (Patient not taking: Reported on 06/08/2018), Disp: 20 tablet, Rfl: 0 .  ibuprofen (ADVIL,MOTRIN) 800 MG tablet, Take 1 tablet (800 mg total) by mouth 3 (three) times daily.  (Patient not taking: Reported on 08/30/2018), Disp: 21 tablet, Rfl: 0 .  medroxyPROGESTERone (DEPO-PROVERA) 150 MG/ML injection, Inject 1 mL (150 mg total) into the muscle every 3 (three) months., Disp: 1 mL, Rfl: 4  Current Facility-Administered Medications:  .  medroxyPROGESTERone (DEPO-PROVERA) injection 150 mg, 150 mg, Intramuscular, Q90 days, Hermina Staggers, MD, 150 mg at 06/07/18 1338 No Known Allergies  Social History   Tobacco Use  . Smoking status: Current Some Day Smoker    Types: Cigars  . Smokeless tobacco: Never Used  . Tobacco comment: black and milds  Substance Use Topics  . Alcohol use: No    Family History  Problem Relation Age of Onset  . Asthma Other   . Hypertension Other   . Hyperlipidemia Other   . Sleep apnea Other   . Stroke Other   . Heart attack Other       Review of Systems  Constitutional: negative for fatigue and weight loss Respiratory: negative for cough and wheezing Cardiovascular: negative for chest pain, fatigue and palpitations Gastrointestinal: negative for abdominal pain and change in bowel habits Musculoskeletal:negative for myalgias Neurological: negative for gait problems and tremors Behavioral/Psych: negative for abusive relationship, depression Endocrine: negative for temperature intolerance    Genitourinary:negative for abnormal menstrual periods, genital lesions, hot flashes, sexual problems and vaginal discharge Integument/breast: negative for breast lump, breast tenderness, nipple discharge and skin lesion(s)    Objective:       BP  117/77   Pulse 80   Ht 5\' 9"  (1.753 m)   Wt (!) 362 lb 9.6 oz (164.5 kg)   BMI 53.55 kg/m  General:   alert  Skin:   no rash or abnormalities  Lungs:   clear to auscultation bilaterally  Heart:   regular rate and rhythm, S1, S2 normal, no murmur, click, rub or gallop  Breasts:   normal without suspicious masses, skin or nipple changes or axillary nodes  Abdomen:  normal findings: no  organomegaly, soft, non-tender and no hernia  Pelvis:  External genitalia: normal general appearance Urinary system: urethral meatus normal and bladder without fullness, nontender Vaginal: normal without tenderness, induration or masses Cervix: normal appearance Adnexa: normal bimanual exam Uterus: anteverted and non-tender, normal size   Lab Review Urine pregnancy test Labs reviewed yes Radiologic studies reviewed no  50% of 20 min visit spent on counseling and coordination of care.   Assessment:     1. Encounter for gynecological examination  2. Class 3 severe obesity due to excess calories without serious comorbidity with body mass index (BMI) of 50.0 to 59.9 in adult [facility]) - program of caloric reduction, exercise and behavioral modification recommended  3. History of trichomoniasis, treated. - TOC culture done today  4. Screen for STD (sexually transmitted disease)  5. Surveillance for Depo-Provera contraception - doing well    Plan:    Education reviewed: calcium supplements, depression evaluation, low fat, low cholesterol diet, safe sex/STD prevention, self breast exams and weight bearing exercise. Contraception: Depo-Provera injections. Follow up in: 1 year.   Meds ordered this encounter  Medications  . medroxyPROGESTERone (DEPO-PROVERA) injection 150 mg   No orders of the defined types were placed in this encounter.   Brock Bad MD 08-30-2018

## 2018-08-30 NOTE — Progress Notes (Signed)
Presents for AEX/DEPO. C/o getting Trich twice in 3 months (Sept. & Nov) she was treated on [employer].  Condoms given to patient.  Depo given in the RD, tolerated well.  Next DEPO 03/26-04/04/2019  Administrations This Visit    medroxyPROGESTERone (DEPO-PROVERA) injection 150 mg    Admin Date 08/30/2018 Action Given Dose 150 mg Route Intramuscular Administered By Maretta Bees, RMA

## 2018-08-31 LAB — CERVICOVAGINAL ANCILLARY ONLY
Chlamydia: NEGATIVE
NEISSERIA GONORRHEA: NEGATIVE
TRICH (WINDOWPATH): NEGATIVE

## 2018-11-29 ENCOUNTER — Ambulatory Visit: Payer: Medicaid Other

## 2018-11-30 ENCOUNTER — Other Ambulatory Visit: Payer: Self-pay

## 2018-11-30 ENCOUNTER — Ambulatory Visit (INDEPENDENT_AMBULATORY_CARE_PROVIDER_SITE_OTHER): Payer: Medicaid Other

## 2018-11-30 DIAGNOSIS — Z3042 Encounter for surveillance of injectable contraceptive: Secondary | ICD-10-CM

## 2018-11-30 NOTE — Progress Notes (Signed)
Patient ID: Patricia Duran, female   DOB: 30-Dec-1995, 23 y.o.   MRN: [id number] I have reviewed the chart and agree with nursing staff's documentation of this patient's encounter.  Scheryl Darter, MD 11/30/2018 5:00 PM

## 2018-11-30 NOTE — Progress Notes (Signed)
Nurse visit for pt supply Depo. Pt is within her window. Depo given R Del w/o difficulty.  Next Depo due Jun 25-Jul 9

## 2019-02-09 ENCOUNTER — Encounter (HOSPITAL_COMMUNITY): Payer: Self-pay

## 2019-02-09 ENCOUNTER — Emergency Department (HOSPITAL_COMMUNITY)
Admission: EM | Admit: 2019-02-09 | Discharge: 2019-02-10 | Disposition: A | Discharge status: Home / Self Care | Payer: Medicaid Other | Attending: Emergency Medicine | Admitting: Emergency Medicine

## 2019-02-09 ENCOUNTER — Other Ambulatory Visit: Payer: Self-pay

## 2019-02-09 DIAGNOSIS — N760 Acute vaginitis: Secondary | ICD-10-CM | POA: Diagnosis not present

## 2019-02-09 DIAGNOSIS — Z79899 Other long term (current) drug therapy: Secondary | ICD-10-CM | POA: Diagnosis not present

## 2019-02-09 DIAGNOSIS — F17290 Nicotine dependence, other tobacco product, uncomplicated: Secondary | ICD-10-CM | POA: Insufficient documentation

## 2019-02-09 DIAGNOSIS — J45909 Unspecified asthma, uncomplicated: Secondary | ICD-10-CM | POA: Insufficient documentation

## 2019-02-09 DIAGNOSIS — B9689 Other specified bacterial agents as the cause of diseases classified elsewhere: Secondary | ICD-10-CM | POA: Diagnosis not present

## 2019-02-09 DIAGNOSIS — R300 Dysuria: Secondary | ICD-10-CM | POA: Diagnosis not present

## 2019-02-09 LAB — URINALYSIS, ROUTINE W REFLEX MICROSCOPIC
Bacteria, UA: NONE SEEN
Bilirubin Urine: NEGATIVE
Glucose, UA: NEGATIVE mg/dL
Hgb urine dipstick: NEGATIVE
Ketones, ur: NEGATIVE mg/dL
Nitrite: NEGATIVE
Protein, ur: NEGATIVE mg/dL
Specific Gravity, Urine: 1.027 (ref 1.005–1.030)
pH: 6 (ref 5.0–8.0)

## 2019-02-09 LAB — WET PREP, GENITAL
Sperm: NONE SEEN
Trich, Wet Prep: NONE SEEN
Yeast Wet Prep HPF POC: NONE SEEN

## 2019-02-09 LAB — POC URINE PREG, ED: Preg Test, Ur: NEGATIVE

## 2019-02-09 MED ORDER — PHENAZOPYRIDINE HCL 95 MG PO TABS: 95.0000 mg | ORAL_TABLET | Freq: Three times a day (TID) | ORAL | 0 refills | Status: DC | PRN

## 2019-02-09 MED ORDER — AZITHROMYCIN 250 MG PO TABS
1000.0000 mg | ORAL_TABLET | Freq: Once | ORAL | Status: AC
  Administered 2019-02-09: 1000 mg via ORAL
  Filled 2019-02-09: qty 4

## 2019-02-09 MED ORDER — STERILE WATER FOR INJECTION IJ SOLN
INTRAMUSCULAR | Status: AC
  Administered 2019-02-09: 10 mL
  Filled 2019-02-09: qty 10

## 2019-02-09 MED ORDER — CEFTRIAXONE SODIUM 250 MG IJ SOLR
250.0000 mg | Freq: Once | INTRAMUSCULAR | Status: AC
  Administered 2019-02-09: 250 mg via INTRAMUSCULAR
  Filled 2019-02-09: qty 250

## 2019-02-09 MED ORDER — METRONIDAZOLE 500 MG PO TABS: 500.0000 mg | ORAL_TABLET | Freq: Two times a day (BID) | ORAL | 0 refills | Status: DC

## 2019-02-09 NOTE — ED Notes (Signed)
Bed: IY64 Expected date: 02/09/19 Expected time: 9:14 PM Means of arrival:  Comments:

## 2019-02-09 NOTE — ED Notes (Signed)
Urine culture sent to the lab. 

## 2019-02-09 NOTE — ED Provider Notes (Signed)
[facility] DEPT Provider Note   CSN: [id number] Arrival date & time: 02/09/19  2117    History   Chief Complaint Chief Complaint  Patient presents with  . Dysuria  . Exposure to STD    HPI Patricia Duran is a 23 y.o. female.     Patricia Duran is a 23 y.o. female with a history of asthma and obesity, who presents for evaluation of dysuria.  Patient reports she was sexually active last night and this morning and since then has noted some burning with urination.  She denies urinary frequency, no hematuria or flank pain, no foul odor to urine.  She has not had any nausea, vomiting or abdominal pain.  She denies vaginal discharge or vaginal bleeding.  Does report that she does not use protection and is concerned about potential STDs and would like to be tested.     Past Medical History:  Diagnosis Date  . Asthma   . Obesity     Patient Active Problem List   Diagnosis Date Noted  . Dysmenorrhea 05/27/2016    Past Surgical History:  Procedure Laterality Date  . TOE SURGERY       OB History    Gravida  0   Para  0   Term  0   Preterm  0   AB  0   Living  0     SAB  0   TAB  0   Ectopic  0   Multiple  0   Live Births  0            Home Medications    Prior to Admission medications   Medication Sig Start Date End Date Taking? Authorizing Provider  albuterol (PROVENTIL HFA;VENTOLIN HFA) 108 (90 BASE) MCG/ACT inhaler Inhale 1 puff into the lungs every 6 (six) hours as needed for wheezing or shortness of breath.    [provider]  cyclobenzaprine (FLEXERIL) 10 MG tablet Take 1 tablet (10 mg total) by mouth 2 (two) times daily as needed for muscle spasms. Patient not taking: Reported on 06/08/2018 07/24/16   Elnora Morrison, MD  ibuprofen (ADVIL,MOTRIN) 800 MG tablet Take 1 tablet (800 mg total) by mouth 3 (three) times daily. Patient not taking: Reported on 08/30/2018 06/09/14   Charlann Lange, PA-C   medroxyPROGESTERone (DEPO-PROVERA) 150 MG/ML injection Inject 1 mL (150 mg total) into the muscle every 3 (three) months. 08/29/18   Chancy Milroy, MD    Family History Family History  Problem Relation Age of Onset  . Asthma Other   . Hypertension Other   . Hyperlipidemia Other   . Sleep apnea Other   . Stroke Other   . Heart attack Other     Social History Social History   Tobacco Use  . Smoking status: Current Some Day Smoker    Types: Cigars  . Smokeless tobacco: Never Used  . Tobacco comment: black and milds  Substance Use Topics  . Alcohol use: No  . Drug use: No     Allergies   Patient has no known allergies.   Review of Systems Review of Systems  Constitutional: Negative for chills and fever.  Gastrointestinal: Negative for abdominal pain, constipation, diarrhea, nausea and vomiting.  Genitourinary: Positive for dysuria. Negative for flank pain, frequency, genital sores, hematuria, pelvic pain, vaginal bleeding and vaginal discharge.  Musculoskeletal: Negative for back pain.  Skin: Negative for color change and rash.  All other systems reviewed and are  negative.    Physical Exam Updated Vital Signs BP (!) 141/86 (BP Location: Left Arm)   Pulse (!) 118   Temp 99.3 F (37.4 C) (Oral)   Resp 17   SpO2 99%   Physical Exam Vitals signs and nursing note reviewed. Exam conducted with a chaperone present.  Constitutional:      General: She is not in acute distress.    Appearance: Normal appearance. She is well-developed. She is obese. She is not ill-appearing or diaphoretic.  HENT:     Head: Normocephalic and atraumatic.     Mouth/Throat:     Mouth: Mucous membranes are moist.     Pharynx: Oropharynx is clear.  Eyes:     General:        Right eye: No discharge.        Left eye: No discharge.  Pulmonary:     Effort: Pulmonary effort is normal. No respiratory distress.  Abdominal:     General: Abdomen is flat. Bowel sounds are normal. There is no  distension.     Palpations: Abdomen is soft.     Tenderness: There is no abdominal tenderness. There is no guarding.     Comments: Abdomen soft, nondistended, nontender to palpation in all quadrants without guarding or peritoneal signs, No cva tenderness bilat  Genitourinary:    Comments: Chaperone present during pelvic exam. No external genital lesions noted. Speculum exam reveals small amount of gray-white discharge, no evidence of cervicitis.  No bleeding. Bimanual exam shows with no cervical motion tenderness, no adnexal tenderness or palpable masses. Skin:    General: Skin is warm and dry.  Neurological:     Mental Status: She is alert and oriented to person, place, and time.     Coordination: Coordination normal.  Psychiatric:        Mood and Affect: Mood normal.        Behavior: Behavior normal.      ED Treatments / Results  Labs (all labs ordered are listed, but only abnormal results are displayed) Labs Reviewed  WET PREP, GENITAL - Abnormal; Notable for the following components:      Result Value   Clue Cells Wet Prep HPF POC PRESENT (*)    WBC, Wet Prep HPF POC MANY (*)    All other components within normal limits  URINALYSIS, ROUTINE W REFLEX MICROSCOPIC - Abnormal; Notable for the following components:   Leukocytes,Ua TRACE (*)    All other components within normal limits  URINE CULTURE  RPR  HIV ANTIBODY (ROUTINE TESTING W REFLEX)  POC URINE PREG, ED  GC/CHLAMYDIA PROBE AMP ([place]) NOT AT [facility]    EKG None  Radiology No results found.  Procedures Procedures (including critical care time)  Medications Ordered in ED Medications  cefTRIAXone (ROCEPHIN) injection 250 mg (250 mg Intramuscular Given 02/09/19 2347)  azithromycin (ZITHROMAX) tablet 1,000 mg (1,000 mg Oral Given 02/09/19 2347)  sterile water (preservative free) injection (10 mLs  Given 02/09/19 2347)     Initial Impression / Assessment and Plan / ED Course  I have reviewed the triage  vital signs and the nursing notes.  Pertinent labs & imaging results that were available during my care of the patient were reviewed by me and considered in my medical decision making (see chart for details).  Pt presents with dysuria after having unprotected sex.  Urinalysis without clear signs of infection, culture sent.  Pt understands that they have GC/Chlamydia cultures pending and that they will need to  inform all sexual partners if results return positive. Pt has been treated prophylactically with azithromycin and Rocephin due to pts history, pelvic exam, and wet prep with increased WBCs.  Pt not concerning for PID because hemodynamically stable and no cervical motion tenderness on pelvic exam. Pt has also been treated with Flagyl for Bacterial Vaginosis. Pt has been advised to not drink alcohol while on this medication.  Suspect that dysuria may have been because by urethral irritation from intercourse, patient to take Pyridium, she is aware the culture was sent which will be called if antibiotics are indicated for urinary tract infection.  Return precautions discussed.  Patient to be discharged with instructions to follow up with OBGYN/PCP. Discussed importance of using protection when sexually active.    Final Clinical Impressions(s) / ED Diagnoses   Final diagnoses:  Dysuria  BV (bacterial vaginosis)    ED Discharge Orders         Ordered    metroNIDAZOLE (FLAGYL) 500 MG tablet  2 times daily     02/09/19 2353    phenazopyridine (PYRIDIUM) 95 MG tablet  3 times daily PRN     02/09/19 2353           Dartha LodgeFord, Elycia Woodside N, PA-C 02/11/19 1328    Terrilee FilesButler, Michael C, MD 02/11/19 1500

## 2019-02-09 NOTE — ED Triage Notes (Signed)
Pt reports burning with urination that started today. Denies foul odor, discharge, or hematuria. Reports having unprotected sex yesterday. Unsure of partners STI status. Pt is on depo and states that she does not have a period.

## 2019-02-10 LAB — RPR: RPR Ser Ql: NONREACTIVE

## 2019-02-10 LAB — HIV ANTIBODY (ROUTINE TESTING W REFLEX): HIV Screen 4th Generation wRfx: NONREACTIVE

## 2019-02-10 NOTE — Discharge Instructions (Signed)
STD testing pending you will be called in 2 to 3 days with any positive results.  You have Patricia Duran been treated for gonorrhea and chlamydia.  Your wet prep also showed BV, please take Flagyl for the next 7 days as directed, do not drink alcohol while taking this medication as it can cause severe nausea and vomiting.  You have a urine culture sent and they will call if it grows anything that would require additional antibiotics.  You may take Azo for dysuria, this will cause your urine to be an orange-red color and this is normal.  You can follow-up at the health department in the future for any STD testing needs.

## 2019-02-12 ENCOUNTER — Other Ambulatory Visit: Payer: Self-pay

## 2019-02-12 ENCOUNTER — Encounter (HOSPITAL_COMMUNITY): Payer: Self-pay | Admitting: Emergency Medicine

## 2019-02-12 ENCOUNTER — Ambulatory Visit (HOSPITAL_COMMUNITY)
Admission: EM | Admit: 2019-02-12 | Discharge: 2019-02-12 | Disposition: A | Discharge status: Home / Self Care | Payer: Medicaid Other | Attending: Family Medicine | Admitting: Family Medicine

## 2019-02-12 DIAGNOSIS — N390 Urinary tract infection, site not specified: Secondary | ICD-10-CM | POA: Diagnosis not present

## 2019-02-12 LAB — GC/CHLAMYDIA PROBE AMP (~~LOC~~) NOT AT ARMC
Chlamydia: NEGATIVE
Neisseria Gonorrhea: NEGATIVE

## 2019-02-12 LAB — POCT URINALYSIS DIP (DEVICE)
Glucose, UA: 250 mg/dL — AB
Hgb urine dipstick: NEGATIVE
Ketones, ur: 15 mg/dL — AB
Nitrite: POSITIVE — AB
Protein, ur: >=300 mg/dL — AB
Specific Gravity, Urine: 1.02 (ref 1.005–1.030)
Urobilinogen, UA: 4 mg/dL — ABNORMAL HIGH (ref 0.0–1.0)
pH: 5 (ref 5.0–8.0)

## 2019-02-12 MED ORDER — NITROFURANTOIN MONOHYD MACRO 100 MG PO CAPS: 100.0000 mg | ORAL_CAPSULE | Freq: Two times a day (BID) | ORAL | 0 refills | Status: AC

## 2019-02-12 MED ORDER — FLUCONAZOLE 150 MG PO TABS: 150.0000 mg | ORAL_TABLET | Freq: Once | ORAL | 0 refills | Status: AC

## 2019-02-12 NOTE — ED Provider Notes (Signed)
[facility]    CSN: [id number] Arrival date & time: 02/12/19  1317      History   Chief Complaint Chief Complaint  Patient presents with  . Urinary Tract Infection    HPI Patricia Duran is a 23 y.o. female history of asthma, obesity, presenting today for evaluation of possible UTI.  Patient states that since Friday, over the past 4 days she has had dysuria, increased frequency and incomplete voiding.  She has had a pressure sensation.  States that symptoms began after she had sexual intercourse frequently.  She was seen in the emergency room on Friday and was noted to have clue cells and treated for BV.  UA was unconvincing for UTI, culture was sent, but has not resulted yet.  Has had negative RPR and HIV testing.  GC/chlamydia pending.  This was empirically treated in the ED.  HPI  Past Medical History:  Diagnosis Date  . Asthma   . Obesity     Patient Active Problem List   Diagnosis Date Noted  . Dysmenorrhea 05/27/2016    Past Surgical History:  Procedure Laterality Date  . TOE SURGERY      OB History    Gravida  0   Para  0   Term  0   Preterm  0   AB  0   Living  0     SAB  0   TAB  0   Ectopic  0   Multiple  0   Live Births  0            Home Medications    Prior to Admission medications   Medication Sig Start Date End Date Taking? Authorizing Provider  albuterol (PROVENTIL HFA;VENTOLIN HFA) 108 (90 BASE) MCG/ACT inhaler Inhale 1 puff into the lungs every 6 (six) hours as needed for wheezing or shortness of breath.    [provider]  cyclobenzaprine (FLEXERIL) 10 MG tablet Take 1 tablet (10 mg total) by mouth 2 (two) times daily as needed for muscle spasms. Patient not taking: Reported on 06/08/2018 07/24/16   Blane OharaZavitz, Joshua, MD  fluconazole (DIFLUCAN) 150 MG tablet Take 1 tablet (150 mg total) by mouth once for 1 dose. 02/12/19 02/12/19  Wieters, Hallie C, PA-C  ibuprofen (ADVIL,MOTRIN) 800 MG tablet Take 1  tablet (800 mg total) by mouth 3 (three) times daily. Patient not taking: Reported on 08/30/2018 06/09/14   Elpidio AnisUpstill, Shari, PA-C  medroxyPROGESTERone (DEPO-PROVERA) 150 MG/ML injection Inject 1 mL (150 mg total) into the muscle every 3 (three) months. 08/29/18   Hermina StaggersErvin, Michael L, MD  metroNIDAZOLE (FLAGYL) 500 MG tablet Take 1 tablet (500 mg total) by mouth 2 (two) times daily. One po bid x 7 days 02/09/19   Dartha LodgeFord, Kelsey N, PA-C  nitrofurantoin, macrocrystal-monohydrate, (MACROBID) 100 MG capsule Take 1 capsule (100 mg total) by mouth 2 (two) times daily for 5 days. 02/12/19 02/17/19  Wieters, Hallie C, PA-C  phenazopyridine (PYRIDIUM) 95 MG tablet Take 1 tablet (95 mg total) by mouth 3 (three) times daily as needed for pain. 02/09/19   Dartha LodgeFord, Kelsey N, PA-C    Family History Family History  Problem Relation Age of Onset  . Asthma Other   . Hypertension Other   . Hyperlipidemia Other   . Sleep apnea Other   . Stroke Other   . Heart attack Other     Social History Social History   Tobacco Use  . Smoking status: Current Some Day Smoker  Types: Cigars  . Smokeless tobacco: Never Used  . Tobacco comment: black and milds  Substance Use Topics  . Alcohol use: No  . Drug use: No     Allergies   Patient has no known allergies.   Review of Systems Review of Systems  Constitutional: Negative for fever.  Respiratory: Negative for shortness of breath.   Cardiovascular: Negative for chest pain.  Gastrointestinal: Negative for abdominal pain, diarrhea, nausea and vomiting.  Genitourinary: Positive for dysuria, frequency and urgency. Negative for flank pain, genital sores, hematuria, menstrual problem, vaginal bleeding, vaginal discharge and vaginal pain.  Musculoskeletal: Negative for back pain.  Skin: Negative for rash.  Neurological: Negative for dizziness, light-headedness and headaches.     Physical Exam Triage Vital Signs ED Triage Vitals [02/12/19 1411]  Enc Vitals Group     BP  (!) 149/93     Pulse Rate 99     Resp 18     Temp 98.2 F (36.8 C)     Temp Source Oral     SpO2 97 %     Weight      Height      Head Circumference      Peak Flow      Pain Score 8     Pain Loc      Pain Edu?      Excl. in GC?    No data found.  Updated Vital Signs BP (!) 149/93 (BP Location: Right Arm)   Pulse 99   Temp 98.2 F (36.8 C) (Oral)   Resp 18   SpO2 97%   Visual Acuity Right Eye Distance:   Left Eye Distance:   Bilateral Distance:    Right Eye Near:   Left Eye Near:    Bilateral Near:     Physical Exam Vitals signs and nursing note reviewed.  Constitutional:      General: She is not in acute distress.    Appearance: She is well-developed.  HENT:     Head: Normocephalic and atraumatic.  Eyes:     Conjunctiva/sclera: Conjunctivae normal.  Neck:     Musculoskeletal: Neck supple.  Cardiovascular:     Rate and Rhythm: Normal rate and regular rhythm.     Heart sounds: No murmur.  Pulmonary:     Effort: Pulmonary effort is normal. No respiratory distress.     Breath sounds: Normal breath sounds.  Abdominal:     Palpations: Abdomen is soft.     Tenderness: There is no abdominal tenderness.     Comments: Soft, nondistended, nontender to light deep palpation throughout all 4 quadrants of abdomen  Skin:    General: Skin is warm and dry.  Neurological:     Mental Status: She is alert.      UC Treatments / Results  Labs (all labs ordered are listed, but only abnormal results are displayed) Labs Reviewed  POCT URINALYSIS DIP (DEVICE) - Abnormal; Notable for the following components:      Result Value   Glucose, UA 250 (*)    Bilirubin Urine SMALL (*)    Ketones, ur 15 (*)    Protein, ur >=300 (*)    Urobilinogen, UA 4.0 (*)    Nitrite POSITIVE (*)    Leukocytes,Ua LARGE (*)    All other components within normal limits  URINE CULTURE    EKG None  Radiology No results found.  Procedures Procedures (including critical care time)   Medications Ordered in UC Medications - No data  to display  Initial Impression / Assessment and Plan / UC Course  I have reviewed the triage vital signs and the nursing notes.  Pertinent labs & imaging results that were available during my care of the patient were reviewed by me and considered in my medical decision making (see chart for details).     Large leuks, positive nitrites, will treat for UTI today with Macrobid twice daily x5 days.  Vital signs stable, do not suspect pyelonephritis at this time.  Culture sent to repeat to confirm sensitivities.  Advised to finish course of Flagyl, also sent in Diflucan to treat any yeast that may be contributing to irritation from antibiotic use.Discussed strict return precautions. Patient verbalized understanding and is agreeable with plan.  Final Clinical Impressions(s) / UC Diagnoses   Final diagnoses:  Urinary tract infection without hematuria, site unspecified     Discharge Instructions     Urine showed evidence of infection. We are treating you with macorbid twice daily for 5 days. Be sure to take full course. Stay hydrated- urine should be pale yellow to clear.   Take 1 tab diflucan today, repeat once finished with ALL of antibiotics Finish flagyl course for BV  Please return or follow up with your primary provider if symptoms not improving with treatment. Please return sooner if you have worsening of symptoms or develop fever, nausea, vomiting, abdominal pain, back pain, lightheadedness, dizziness.   ED Prescriptions    Medication Sig Dispense Auth. Provider   nitrofurantoin, macrocrystal-monohydrate, (MACROBID) 100 MG capsule Take 1 capsule (100 mg total) by mouth 2 (two) times daily for 5 days. 10 capsule Wieters, Hallie C, PA-C   fluconazole (DIFLUCAN) 150 MG tablet Take 1 tablet (150 mg total) by mouth once for 1 dose. 2 tablet Wieters, Hallie C, PA-C     Controlled Substance Prescriptions [place] Controlled Substance Registry  consulted? Not Applicable   Janith Lima, [place] 02/12/19 1607

## 2019-02-12 NOTE — ED Triage Notes (Signed)
Pt here for UTI sx; pt seen recently

## 2019-02-12 NOTE — Discharge Instructions (Signed)
Urine showed evidence of infection. We are treating you with macorbid twice daily for 5 days. Be sure to take full course. Stay hydrated- urine should be pale yellow to clear.   Take 1 tab diflucan today, repeat once finished with ALL of antibiotics Finish flagyl course for BV  Please return or follow up with your primary provider if symptoms not improving with treatment. Please return sooner if you have worsening of symptoms or develop fever, nausea, vomiting, abdominal pain, back pain, lightheadedness, dizziness.

## 2019-02-13 LAB — URINE CULTURE

## 2019-02-22 ENCOUNTER — Other Ambulatory Visit: Payer: Self-pay

## 2019-02-22 ENCOUNTER — Ambulatory Visit (HOSPITAL_COMMUNITY)
Admission: EM | Admit: 2019-02-22 | Discharge: 2019-02-22 | Disposition: A | Discharge status: Home / Self Care | Payer: Medicaid Other | Attending: Family Medicine | Admitting: Family Medicine

## 2019-02-22 ENCOUNTER — Encounter (HOSPITAL_COMMUNITY): Payer: Self-pay

## 2019-02-22 DIAGNOSIS — L0231 Cutaneous abscess of buttock: Secondary | ICD-10-CM | POA: Diagnosis not present

## 2019-02-22 MED ORDER — SULFAMETHOXAZOLE-TRIMETHOPRIM 800-160 MG PO TABS: 1.0000 | ORAL_TABLET | Freq: Two times a day (BID) | ORAL | 0 refills | Status: AC

## 2019-02-22 NOTE — Discharge Instructions (Signed)
Take the antibiotic 2 times a day until gone Take 2 doses today Warm compresses to area Return if this gets worse instead of better

## 2019-02-22 NOTE — ED Provider Notes (Signed)
[facility]    CSN: [id number] Arrival date & time: 02/22/19  1113      History   Chief Complaint Chief Complaint  Patient presents with  . Abscess    HPI LOELLA Patricia Duran is a 23 y.o. female.   HPI  Patient states she has had a painful knot in her buttock.  Growing.  Present for couple of days.  She thinks it might be an abscess forming  Past Medical History:  Diagnosis Date  . Asthma   . Obesity     Patient Active Problem List   Diagnosis Date Noted  . Dysmenorrhea 05/27/2016    Past Surgical History:  Procedure Laterality Date  . TOE SURGERY      OB History    Gravida  0   Para  0   Term  0   Preterm  0   AB  0   Living  0     SAB  0   TAB  0   Ectopic  0   Multiple  0   Live Births  0            Home Medications    Prior to Admission medications   Medication Sig Start Date End Date Taking? Authorizing Provider  albuterol (PROVENTIL HFA;VENTOLIN HFA) 108 (90 BASE) MCG/ACT inhaler Inhale 1 puff into the lungs every 6 (six) hours as needed for wheezing or shortness of breath.    [provider]  medroxyPROGESTERone (DEPO-PROVERA) 150 MG/ML injection Inject 1 mL (150 mg total) into the muscle every 3 (three) months. 08/29/18   Chancy Milroy, MD  sulfamethoxazole-trimethoprim (BACTRIM DS) 800-160 MG tablet Take 1 tablet by mouth 2 (two) times daily for 7 days. 02/22/19 03/01/19  Raylene Everts, MD    Family History Family History  Problem Relation Age of Onset  . Asthma Other   . Hypertension Other   . Hyperlipidemia Other   . Sleep apnea Other   . Stroke Other   . Heart attack Other     Social History Social History   Tobacco Use  . Smoking status: Current Some Day Smoker    Types: Cigars  . Smokeless tobacco: Never Used  . Tobacco comment: black and milds  Substance Use Topics  . Alcohol use: No  . Drug use: No     Allergies   Patient has no known allergies.   Review of Systems Review  of Systems  Constitutional: Negative for chills and fever.  HENT: Negative for ear pain and sore throat.   Eyes: Negative for pain and visual disturbance.  Respiratory: Negative for cough and shortness of breath.   Cardiovascular: Negative for chest pain and palpitations.  Gastrointestinal: Negative for abdominal pain and vomiting.  Genitourinary: Negative for dysuria and hematuria.  Musculoskeletal: Negative for arthralgias and back pain.  Skin: Positive for wound. Negative for color change and rash.  Neurological: Negative for seizures and syncope.  All other systems reviewed and are negative.    Physical Exam Triage Vital Signs ED Triage Vitals [02/22/19 1208]  Enc Vitals Group     BP 130/87     Pulse Rate (!) 104     Resp 18     Temp 98.2 F (36.8 C)     Temp Source Oral     SpO2 97 %     Weight      Height      Head Circumference      Peak Flow  Pain Score 4     Pain Loc      Pain Edu?      Excl. in GC?    No data found.  Updated Vital Signs BP 130/87 (BP Location: Left Arm)   Pulse (!) 104   Temp 98.2 F (36.8 C) (Oral)   Resp 18   SpO2 97%   Visual Acuity     Physical Exam Constitutional:      General: She is not in acute distress.    Appearance: She is well-developed. She is obese.  HENT:     Head: Normocephalic and atraumatic.  Eyes:     Conjunctiva/sclera: Conjunctivae normal.     Pupils: Pupils are equal, round, and reactive to light.  Neck:     Musculoskeletal: Normal range of motion.  Cardiovascular:     Rate and Rhythm: Normal rate.  Pulmonary:     Effort: Pulmonary effort is normal. No respiratory distress.  Abdominal:     General: There is no distension.     Palpations: Abdomen is soft.  Musculoskeletal: Normal range of motion.  Skin:    General: Skin is warm and dry.     Comments: Palpable tender nodule in the center of the left buttock.  Subcutaneous.  No overlying erythema.  No fluctuance.  2 cm across.  Neurological:      General: No focal deficit present.     Mental Status: She is alert.  Psychiatric:        Mood and Affect: Mood normal.      UC Treatments / Results  Labs (all labs ordered are listed, but only abnormal results are displayed) Labs Reviewed - No data to display  EKG   Radiology No results found.  Procedures Procedures (including critical care time)  Medications Ordered in UC Medications - No data to display  Initial Impression / Assessment and Plan / UC Course  I have reviewed the triage vital signs and the nursing notes.  Pertinent labs & imaging results that were available during my care of the patient were reviewed by me and considered in my medical decision making (see chart for details).      Final Clinical Impressions(s) / UC Diagnoses   Final diagnoses:  Abscess of buttock, right     Discharge Instructions     Take the antibiotic 2 times a day until gone Take 2 doses today Warm compresses to area Return if this gets worse instead of better   ED Prescriptions    Medication Sig Dispense Auth. Provider   sulfamethoxazole-trimethoprim (BACTRIM DS) 800-160 MG tablet Take 1 tablet by mouth 2 (two) times daily for 7 days. 14 tablet Eustace MooreNelson, Lliam Hoh Sue, MD     Controlled Substance Prescriptions [place] Controlled Substance Registry consulted? Not Applicable   Eustace MooreNelson, Shiquita Collignon Sue, MD 02/22/19 [phone]

## 2019-02-22 NOTE — ED Triage Notes (Signed)
Pt presents with abscess on right buttocks since yesterday.

## 2019-03-01 ENCOUNTER — Ambulatory Visit (INDEPENDENT_AMBULATORY_CARE_PROVIDER_SITE_OTHER): Payer: Medicaid Other

## 2019-03-01 ENCOUNTER — Other Ambulatory Visit: Payer: Self-pay

## 2019-03-01 ENCOUNTER — Other Ambulatory Visit (INDEPENDENT_AMBULATORY_CARE_PROVIDER_SITE_OTHER): Payer: Medicaid Other

## 2019-03-01 DIAGNOSIS — Z3042 Encounter for surveillance of injectable contraceptive: Secondary | ICD-10-CM | POA: Diagnosis not present

## 2019-03-01 MED ORDER — MEDROXYPROGESTERONE ACETATE 150 MG/ML IM SUSP
150.0000 mg | Freq: Once | INTRAMUSCULAR | Status: AC
  Administered 2019-03-01: 14:00:00 150 mg via INTRAMUSCULAR

## 2019-03-01 NOTE — Progress Notes (Signed)
Patient is here for depo injection, she is on time and within her window. Injection given in R arm without difficulty. Next injection due 9/24-10/8.

## 2019-04-05 IMAGING — CR DG CHEST 2V
2 series · 2 of 2 positions shown · non-contrast
Comparison: None.

CLINICAL DATA: 22-year-old female with productive cough.

EXAM:
CHEST - 2 VIEW

[w chest pa]
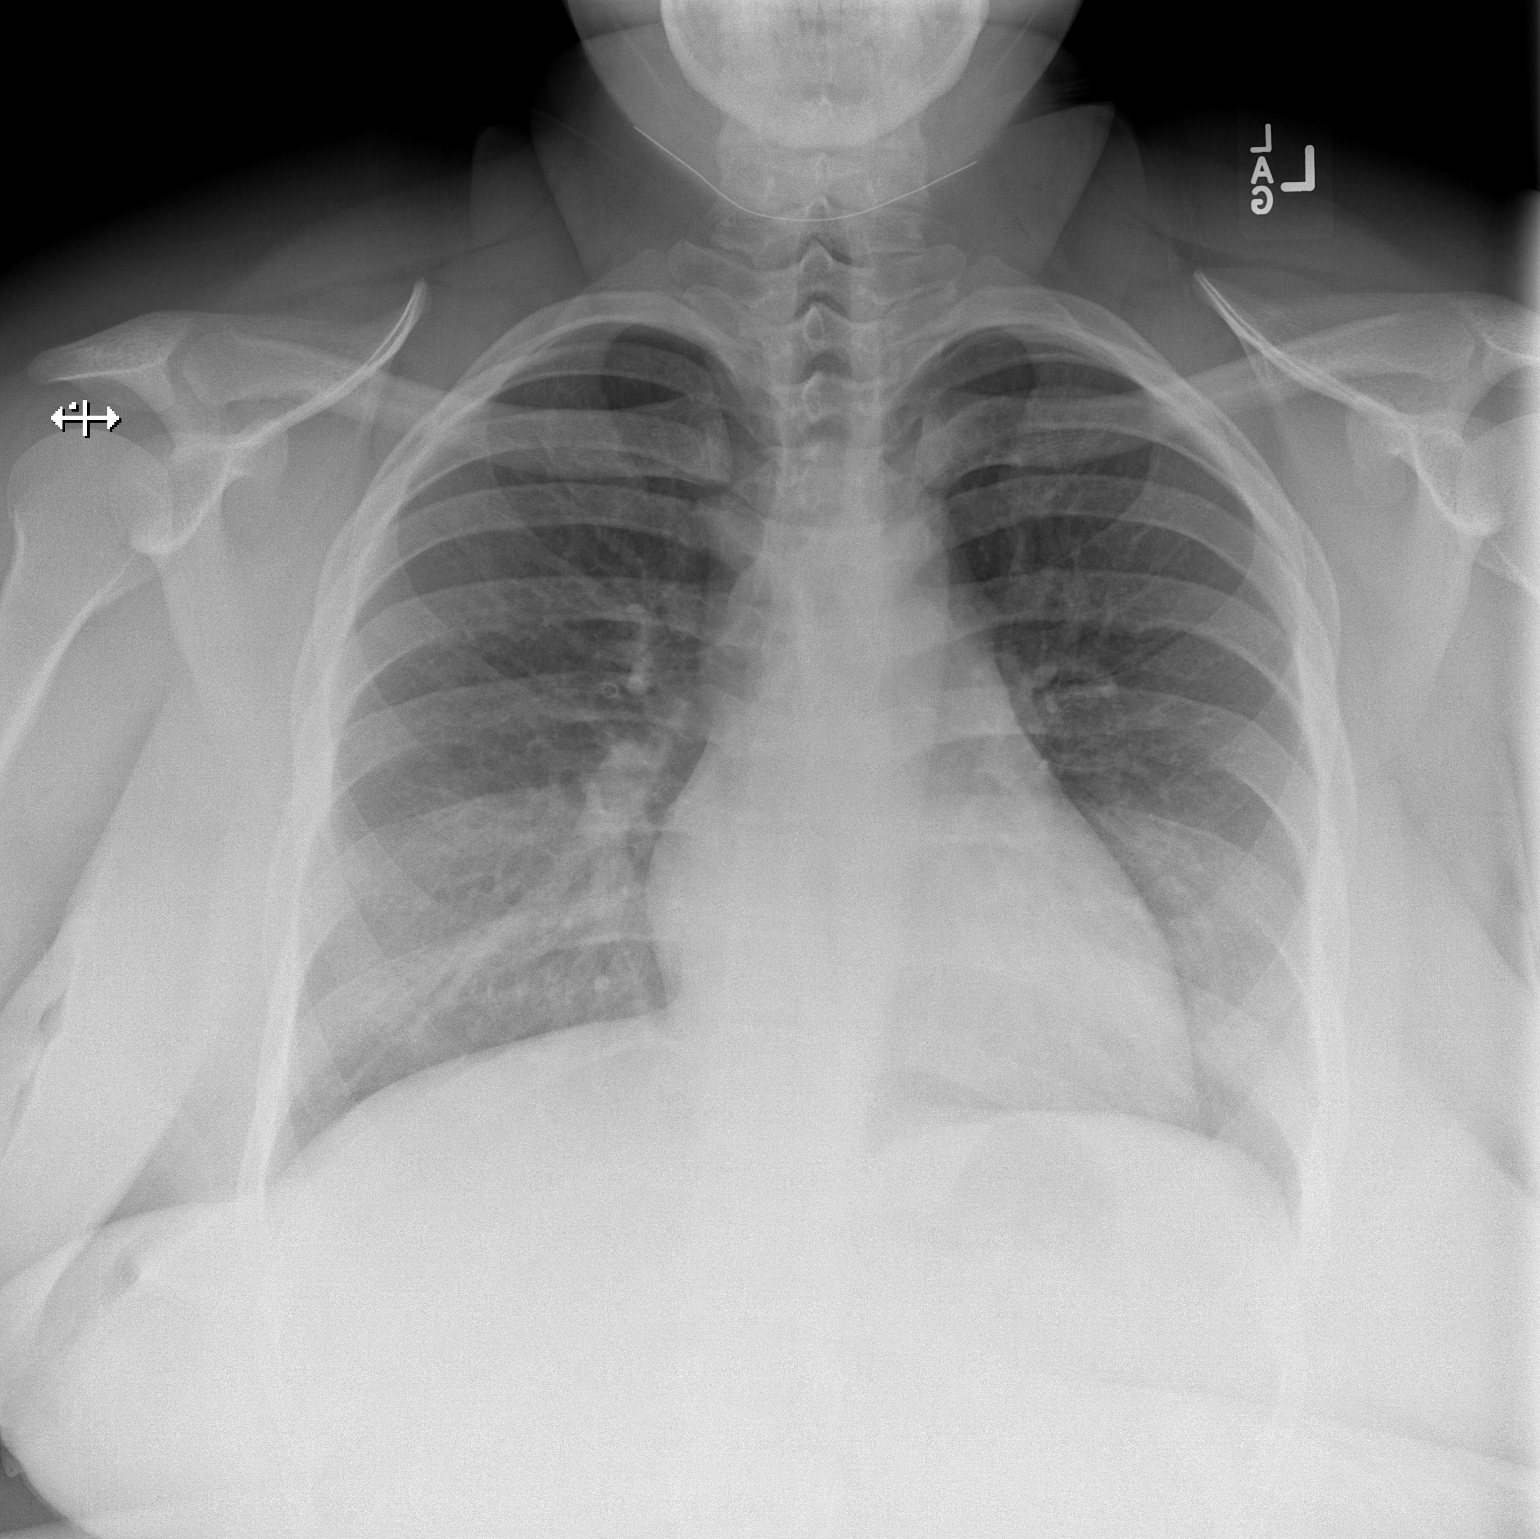

[w chest lat]
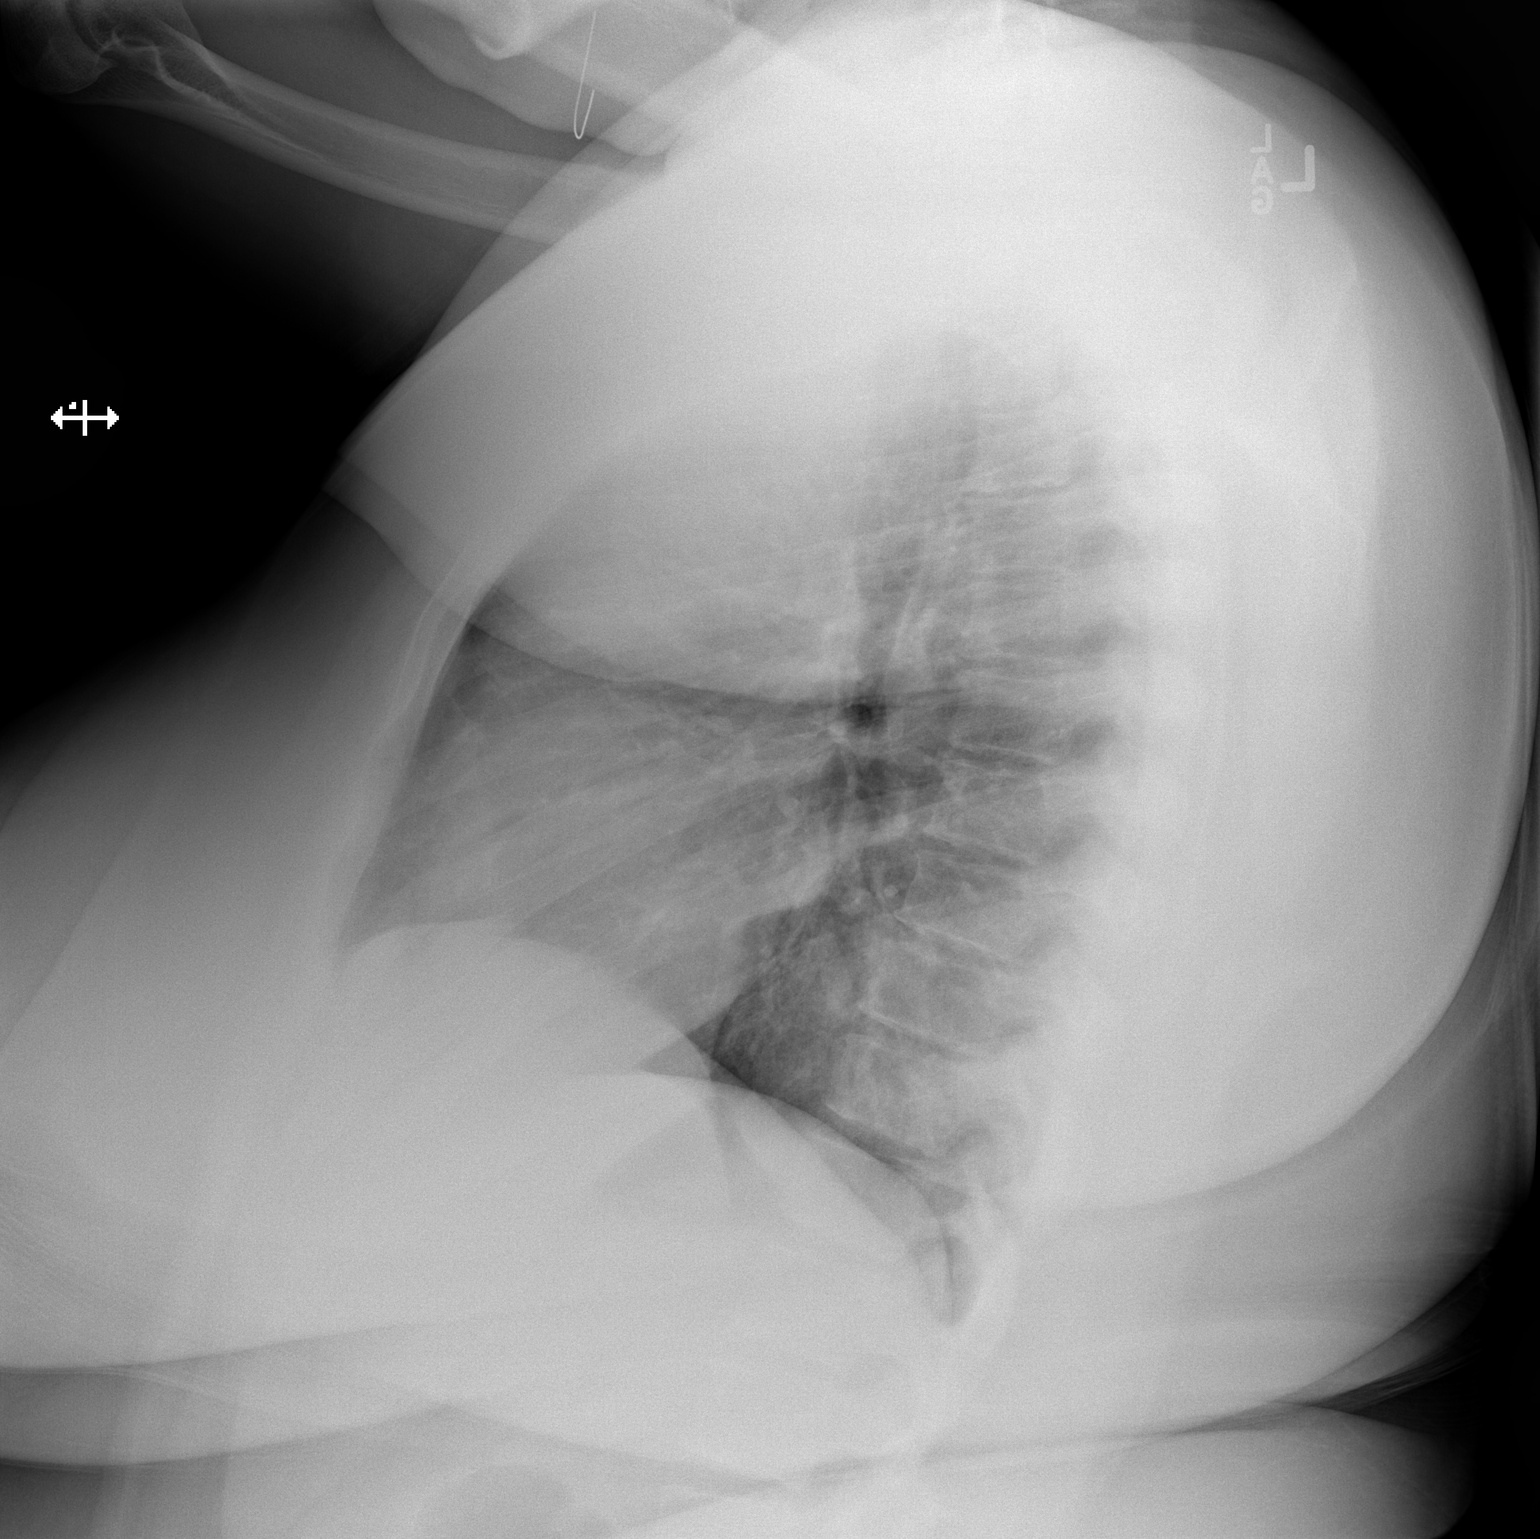

[2 of 2 positions shown; findings below may reference images not displayed]

FINDINGS: The heart size and mediastinal contours are within normal limits.
Both lungs are clear. The visualized skeletal structures are
unremarkable.
IMPRESSION: No active cardiopulmonary disease.

## 2019-05-18 ENCOUNTER — Ambulatory Visit: Payer: Medicaid Other

## 2019-05-24 ENCOUNTER — Ambulatory Visit (HOSPITAL_COMMUNITY)
Admission: EM | Admit: 2019-05-24 | Discharge: 2019-05-24 | Disposition: A | Discharge status: Home / Self Care | Payer: Medicaid Other | Attending: Urgent Care | Admitting: Urgent Care

## 2019-05-24 ENCOUNTER — Encounter (HOSPITAL_COMMUNITY): Payer: Self-pay

## 2019-05-24 ENCOUNTER — Other Ambulatory Visit: Payer: Self-pay

## 2019-05-24 DIAGNOSIS — W57XXXA Bitten or stung by nonvenomous insect and other nonvenomous arthropods, initial encounter: Secondary | ICD-10-CM | POA: Diagnosis not present

## 2019-05-24 DIAGNOSIS — L259 Unspecified contact dermatitis, unspecified cause: Secondary | ICD-10-CM | POA: Diagnosis not present

## 2019-05-24 DIAGNOSIS — R21 Rash and other nonspecific skin eruption: Secondary | ICD-10-CM | POA: Diagnosis not present

## 2019-05-24 MED ORDER — BETAMETHASONE DIPROPIONATE 0.05 % EX OINT: TOPICAL_OINTMENT | Freq: Two times a day (BID) | CUTANEOUS | 0 refills | Status: DC

## 2019-05-24 MED ORDER — HYDROXYZINE HCL 25 MG PO TABS: 12.5000 mg | ORAL_TABLET | Freq: Three times a day (TID) | ORAL | 0 refills | Status: DC | PRN

## 2019-05-24 NOTE — ED Triage Notes (Signed)
Pt states she has some insect bite on her left side and left thigh. Pt states she has seen some bed bugs at her job.

## 2019-05-24 NOTE — ED Provider Notes (Signed)
MRN: [id number] DOB: [birth year]  Subjective:   Patricia Duran is a 23 y.o. female presenting for several day history of rash and itching to areas of her left flank side and left thigh.  Patient states that she has had some bedbugs where she works.  She has not tried medications for relief.  Wanted to make sure she got checked.  Denies fever, pain, drainage of pus or bleeding.    Current Facility-Administered Medications:  .  medroxyPROGESTERone (DEPO-PROVERA) injection 150 mg, 150 mg, Intramuscular, Q90 days, Chancy Milroy, MD, 150 mg at 11/30/18 1536  Current Outpatient Medications:  .  albuterol (PROVENTIL HFA;VENTOLIN HFA) 108 (90 BASE) MCG/ACT inhaler, Inhale 1 puff into the lungs every 6 (six) hours as needed for wheezing or shortness of breath., Disp: , Rfl:  .  medroxyPROGESTERone (DEPO-PROVERA) 150 MG/ML injection, Inject 1 mL (150 mg total) into the muscle every 3 (three) months., Disp: 1 mL, Rfl: 4    No Known Allergies   Past Medical History:  Diagnosis Date  . Asthma   . Obesity      Past Surgical History:  Procedure Laterality Date  . TOE SURGERY      Review of Systems  Constitutional: Negative for fever and malaise/fatigue.  HENT: Negative for congestion, ear pain, sinus pain and sore throat.   Eyes: Negative for discharge and redness.  Respiratory: Negative for cough, hemoptysis, shortness of breath and wheezing.   Cardiovascular: Negative for chest pain.  Gastrointestinal: Negative for abdominal pain, blood in stool, constipation, diarrhea, nausea and vomiting.  Genitourinary: Negative for dysuria, flank pain, frequency, hematuria and urgency.  Musculoskeletal: Negative for myalgias.  Skin: Negative for rash.  Neurological: Negative for dizziness, weakness and headaches.  Psychiatric/Behavioral: Negative for depression and substance abuse.    Objective:   Vitals: BP 131/68 (BP Location: Left Arm)   Pulse (!) 101   Temp 98.1 F (36.7 C)  (Temporal)   Resp 18   Wt (!) 350 lb (158.8 kg)   SpO2 100%   BMI 51.69 kg/m   Physical Exam Constitutional:      General: She is not in acute distress.    Appearance: Normal appearance. She is well-developed. She is not ill-appearing.  HENT:     Head: Normocephalic and atraumatic.     Nose: Nose normal.     Mouth/Throat:     Mouth: Mucous membranes are moist.     Pharynx: Oropharynx is clear.  Eyes:     General: No scleral icterus.    Extraocular Movements: Extraocular movements intact.     Pupils: Pupils are equal, round, and reactive to light.  Cardiovascular:     Rate and Rhythm: Normal rate.  Pulmonary:     Effort: Pulmonary effort is normal.  Skin:    General: Skin is warm and dry.       Neurological:     General: No focal deficit present.     Mental Status: She is alert and oriented to person, place, and time.  Psychiatric:        Mood and Affect: Mood normal.        Behavior: Behavior normal.     Assessment and Plan :   1. Insect bite, unspecified site, initial encounter   2. Rash and nonspecific skin eruption   3. Contact dermatitis, unspecified contact dermatitis type, unspecified trigger     Start hydroxyzine oral and betamethasone ointment for contact dermatitis likely due to bedbugs. Counseled patient on potential for adverse  effects with medications prescribed/recommended today, ER and return-to-clinic precautions discussed, patient verbalized understanding.    Wallis Bamberg, [place] 05/24/19 4098

## 2019-05-30 ENCOUNTER — Ambulatory Visit (INDEPENDENT_AMBULATORY_CARE_PROVIDER_SITE_OTHER): Payer: Medicaid Other

## 2019-05-30 ENCOUNTER — Other Ambulatory Visit: Payer: Self-pay

## 2019-05-30 DIAGNOSIS — Z3042 Encounter for surveillance of injectable contraceptive: Secondary | ICD-10-CM | POA: Diagnosis not present

## 2019-05-30 MED ORDER — MEDROXYPROGESTERONE ACETATE 150 MG/ML IM SUSP
150.0000 mg | Freq: Once | INTRAMUSCULAR | Status: AC
  Administered 2019-05-30: 150 mg via INTRAMUSCULAR

## 2019-05-30 NOTE — Progress Notes (Signed)
Pt is here for depo injection. She is on time. Injection given . Next injection due 12/23-01/06/21

## 2019-07-11 ENCOUNTER — Encounter: Payer: Self-pay | Admitting: Obstetrics

## 2019-07-11 ENCOUNTER — Other Ambulatory Visit: Payer: Self-pay

## 2019-07-11 ENCOUNTER — Ambulatory Visit (INDEPENDENT_AMBULATORY_CARE_PROVIDER_SITE_OTHER): Payer: Medicaid Other | Admitting: Obstetrics

## 2019-07-11 ENCOUNTER — Other Ambulatory Visit (HOSPITAL_COMMUNITY)
Admission: RE | Admit: 2019-07-11 | Discharge: 2019-07-11 | Disposition: A | Discharge status: Home / Self Care | Payer: Medicaid Other | Source: Ambulatory Visit | Attending: Obstetrics | Admitting: Obstetrics

## 2019-07-11 VITALS — BP 118/83 | HR 83 | Ht 69.0 in | Wt 360.8 lb

## 2019-07-11 DIAGNOSIS — N898 Other specified noninflammatory disorders of vagina: Secondary | ICD-10-CM | POA: Diagnosis not present

## 2019-07-11 DIAGNOSIS — Z3042 Encounter for surveillance of injectable contraceptive: Secondary | ICD-10-CM

## 2019-07-11 DIAGNOSIS — N939 Abnormal uterine and vaginal bleeding, unspecified: Secondary | ICD-10-CM

## 2019-07-11 DIAGNOSIS — Z6841 Body Mass Index (BMI) 40.0 and over, adult: Secondary | ICD-10-CM | POA: Diagnosis not present

## 2019-07-11 MED ORDER — TAYTULLA 1-20 MG-MCG(24) PO CAPS: 1.0000 | ORAL_CAPSULE | Freq: Every day | ORAL | 0 refills | Status: DC

## 2019-07-11 NOTE — Progress Notes (Signed)
GYN presents for irregular periods this month, she bled 11/9-05/2019 stopped and then started again 11/16-present.  She has been using DEPO for [facility] since 2017.

## 2019-07-11 NOTE — Progress Notes (Signed)
Subjective:    Patricia Duran is a 23 y.o. female who presents for vaginal spotting for past several weeks with Depo Provera for contraception.  She has been on Depo since 2017, and occasionally has these bouts of spotting.  She has been given a pack of OCP's to take for 1 month in the past, with resolution of the spotting.  She denies vaginal discharge, irritation or pelvic pain.  The patient has no complaints today. The patient is sexually active. Pertinent past medical history: none.  The information documented in the HPI was reviewed and verified.  Menstrual History: OB History    Gravida  0   Para  0   Term  0   Preterm  0   AB  0   Living  0     SAB  0   TAB  0   Ectopic  0   Multiple  0   Live Births  0            No LMP recorded. Patient has had an injection.   Patient Active Problem List   Diagnosis Date Noted  . Dysmenorrhea 05/27/2016   Past Medical History:  Diagnosis Date  . Asthma   . Obesity     Past Surgical History:  Procedure Laterality Date  . TOE SURGERY       Current Outpatient Medications:  .  albuterol (PROVENTIL HFA;VENTOLIN HFA) 108 (90 BASE) MCG/ACT inhaler, Inhale 1 puff into the lungs every 6 (six) hours as needed for wheezing or shortness of breath., Disp: , Rfl:  .  betamethasone dipropionate (DIPROLENE) 0.05 % ointment, Apply topically 2 (two) times daily. (Patient not taking: Reported on 07/11/2019), Disp: 30 g, Rfl: 0 .  hydrOXYzine (ATARAX/VISTARIL) 25 MG tablet, Take 0.5-1 tablets (12.5-25 mg total) by mouth every 8 (eight) hours as needed for itching. (Patient not taking: Reported on 07/11/2019), Disp: 30 tablet, Rfl: 0 .  medroxyPROGESTERone (DEPO-PROVERA) 150 MG/ML injection, Inject 1 mL (150 mg total) into the muscle every 3 (three) months., Disp: 1 mL, Rfl: 4 .  Norethin Ace-Eth Estrad-FE (TAYTULLA) 1-20 MG-MCG(24) CAPS, Take 1 capsule by mouth daily before breakfast., Disp: 28 capsule, Rfl: 0  Current  Facility-Administered Medications:  .  medroxyPROGESTERone (DEPO-PROVERA) injection 150 mg, 150 mg, Intramuscular, Q90 days, Hermina Staggers, MD, 150 mg at 11/30/18 1536 No Known Allergies  Social History   Tobacco Use  . Smoking status: Current Some Day Smoker    Types: Cigars  . Smokeless tobacco: Never Used  . Tobacco comment: black and milds  Substance Use Topics  . Alcohol use: No    Family History  Problem Relation Age of Onset  . Asthma Other   . Hypertension Other   . Hyperlipidemia Other   . Sleep apnea Other   . Stroke Other   . Heart attack Other        Review of Systems Constitutional: negative for weight loss Genitourinary:positive for abnormal menstrual periods, and negative for vaginal discharge   Objective:   BP 118/83   Pulse 83   Ht 5\' 9"  (1.753 m)   Wt (!) 360 lb 12.8 oz (163.7 kg)   BMI 53.28 kg/m             PE:          General:  Alert and no distress Abdomen:  normal findings: no organomegaly, soft, non-tender and no hernia  Pelvis:  External genitalia: normal general appearance Urinary system: urethral meatus  normal and bladder without fullness, nontender Vaginal: normal without tenderness, induration or masses Cervix: normal appearance Adnexa: normal bimanual exam Uterus: anteverted and non-tender, normal size   Lab Review Urine pregnancy test Labs reviewed yes Radiologic studies reviewed no  50% of 15 min visit spent on counseling and coordination of care.    Assessment:    23 y.o., continuing Depo-Provera injections, no contraindications.   Plan:   1. Surveillance for Depo-Provera contraception  2. Abnormal uterine bleeding (AUB) Rx: - Norethin Ace-Eth Estrad-FE (TAYTULLA) 1-20 MG-MCG(24) CAPS; Take 1 capsule by mouth daily before breakfast.  Dispense: 28 capsule; Refill: 0  3. Vaginal discharge Rx: - Cervicovaginal ancillary only( [place])  4. Class 3 severe obesity due to excess calories without serious  comorbidity with body mass index (BMI) of 50.0 to 59.9 in adult [facility]) - program of caloric restriction, exercise and behavioral modification recommended for weight reduction and long-term maintenance    All questions answered. Chlamydia specimen. Diagnosis explained in detail, including differential. Follow up in 2 months. GC specimen. Wet prep.    Meds ordered this encounter  Medications  . Norethin Ace-Eth Estrad-FE (TAYTULLA) 1-20 MG-MCG(24) CAPS    Sig: Take 1 capsule by mouth daily before breakfast.    Dispense:  28 capsule    Refill:  0    Shelly Bombard, MD 07/11/2019 4:24 PM

## 2019-07-12 ENCOUNTER — Other Ambulatory Visit: Payer: Self-pay | Admitting: Obstetrics

## 2019-07-12 DIAGNOSIS — B9689 Other specified bacterial agents as the cause of diseases classified elsewhere: Secondary | ICD-10-CM

## 2019-07-12 DIAGNOSIS — N760 Acute vaginitis: Secondary | ICD-10-CM

## 2019-07-12 DIAGNOSIS — A5901 Trichomonal vulvovaginitis: Secondary | ICD-10-CM

## 2019-07-12 LAB — CERVICOVAGINAL ANCILLARY ONLY
Bacterial Vaginitis (gardnerella): POSITIVE — AB
Candida Glabrata: NEGATIVE
Candida Vaginitis: NEGATIVE
Chlamydia: NEGATIVE
Comment: NEGATIVE
Comment: NEGATIVE
Comment: NEGATIVE
Comment: NEGATIVE
Comment: NEGATIVE
Comment: NORMAL
Neisseria Gonorrhea: NEGATIVE
Trichomonas: POSITIVE — AB

## 2019-07-12 MED ORDER — METRONIDAZOLE 500 MG PO TABS: 500.0000 mg | ORAL_TABLET | Freq: Two times a day (BID) | ORAL | 2 refills | Status: DC

## 2019-08-09 ENCOUNTER — Other Ambulatory Visit: Payer: Self-pay

## 2019-08-09 ENCOUNTER — Ambulatory Visit (HOSPITAL_COMMUNITY)
Admission: EM | Admit: 2019-08-09 | Discharge: 2019-08-09 | Disposition: A | Discharge status: Home / Self Care | Payer: Medicaid Other | Attending: Emergency Medicine | Admitting: Emergency Medicine

## 2019-08-09 ENCOUNTER — Encounter (HOSPITAL_COMMUNITY): Payer: Self-pay

## 2019-08-09 DIAGNOSIS — R05 Cough: Secondary | ICD-10-CM

## 2019-08-09 DIAGNOSIS — Z79899 Other long term (current) drug therapy: Secondary | ICD-10-CM | POA: Insufficient documentation

## 2019-08-09 DIAGNOSIS — J45909 Unspecified asthma, uncomplicated: Secondary | ICD-10-CM | POA: Diagnosis not present

## 2019-08-09 DIAGNOSIS — Z8249 Family history of ischemic heart disease and other diseases of the circulatory system: Secondary | ICD-10-CM | POA: Diagnosis not present

## 2019-08-09 DIAGNOSIS — Z20828 Contact with and (suspected) exposure to other viral communicable diseases: Secondary | ICD-10-CM | POA: Insufficient documentation

## 2019-08-09 DIAGNOSIS — F17290 Nicotine dependence, other tobacco product, uncomplicated: Secondary | ICD-10-CM | POA: Insufficient documentation

## 2019-08-09 DIAGNOSIS — J069 Acute upper respiratory infection, unspecified: Secondary | ICD-10-CM | POA: Diagnosis not present

## 2019-08-09 DIAGNOSIS — Z793 Long term (current) use of hormonal contraceptives: Secondary | ICD-10-CM | POA: Insufficient documentation

## 2019-08-09 DIAGNOSIS — J029 Acute pharyngitis, unspecified: Secondary | ICD-10-CM | POA: Diagnosis not present

## 2019-08-09 DIAGNOSIS — Z7951 Long term (current) use of inhaled steroids: Secondary | ICD-10-CM | POA: Insufficient documentation

## 2019-08-09 DIAGNOSIS — Z6841 Body Mass Index (BMI) 40.0 and over, adult: Secondary | ICD-10-CM | POA: Insufficient documentation

## 2019-08-09 DIAGNOSIS — E669 Obesity, unspecified: Secondary | ICD-10-CM | POA: Insufficient documentation

## 2019-08-09 DIAGNOSIS — B9789 Other viral agents as the cause of diseases classified elsewhere: Secondary | ICD-10-CM

## 2019-08-09 LAB — POC SARS CORONAVIRUS 2 AG: SARS Coronavirus 2 Ag: NEGATIVE

## 2019-08-09 LAB — POC SARS CORONAVIRUS 2 AG -  ED: SARS Coronavirus 2 Ag: NEGATIVE

## 2019-08-09 MED ORDER — FLUTICASONE PROPIONATE 50 MCG/ACT NA SUSP: 1.0000 | Freq: Every day | NASAL | 0 refills | Status: DC

## 2019-08-09 MED ORDER — CETIRIZINE HCL 10 MG PO CAPS: 10.0000 mg | ORAL_CAPSULE | Freq: Every day | ORAL | 0 refills | Status: DC

## 2019-08-09 MED ORDER — PSEUDOEPH-BROMPHEN-DM 30-2-10 MG/5ML PO SYRP: 5.0000 mL | ORAL_SOLUTION | Freq: Four times a day (QID) | ORAL | 0 refills | Status: DC | PRN

## 2019-08-09 NOTE — ED Triage Notes (Signed)
Pt presents to the UC with nasal congestion, cough and loss of smell x 2 days.

## 2019-08-09 NOTE — ED Provider Notes (Signed)
[facility]    CSN: [id number] Arrival date & time: 08/09/19  1332      History   Chief Complaint Chief Complaint  Patient presents with  . Cough  . Nasal Congestion    HPI COREAN YOSHIMURA is a 23 y.o. female history of asthma, presenting today for evaluation of URI symptoms.  Patient states that Tuesday she began to develop a mild sore throat, since she has developed cough, congestion as well as loss of smell.  Sore throat has improved with using over-the-counter lozenges.  She has also had bilateral ear pressure with her ears feeling popped.  Denies pain. She denies any known fevers.  Denies any known exposure to Covid.  Denies chest pain or shortness of breath.  Occasionally smokes black and milds.  HPI  Past Medical History:  Diagnosis Date  . Asthma   . Obesity     Patient Active Problem List   Diagnosis Date Noted  . Dysmenorrhea 05/27/2016    Past Surgical History:  Procedure Laterality Date  . TOE SURGERY      OB History    Gravida  0   Para  0   Term  0   Preterm  0   AB  0   Living  0     SAB  0   TAB  0   Ectopic  0   Multiple  0   Live Births  0            Home Medications    Prior to Admission medications   Medication Sig Start Date End Date Taking? Authorizing Provider  albuterol (PROVENTIL HFA;VENTOLIN HFA) 108 (90 BASE) MCG/ACT inhaler Inhale 1 puff into the lungs every 6 (six) hours as needed for wheezing or shortness of breath.    [provider]  brompheniramine-pseudoephedrine-DM 30-2-10 MG/5ML syrup Take 5 mLs by mouth 4 (four) times daily as needed. 08/09/19   Korissa Horsford C, PA-C  Cetirizine HCl 10 MG CAPS Take 1 capsule (10 mg total) by mouth daily. 08/09/19   Tyjai Matuszak C, PA-C  fluticasone (FLONASE) 50 MCG/ACT nasal spray Place 1-2 sprays into both nostrils daily for 7 days. 08/09/19 08/16/19  Kura Bethards C, PA-C  medroxyPROGESTERone (DEPO-PROVERA) 150 MG/ML injection Inject 1  mL (150 mg total) into the muscle every 3 (three) months. 08/29/18   Chancy Milroy, MD  Norethin Ace-Eth Estrad-FE (TAYTULLA) 1-20 MG-MCG(24) CAPS Take 1 capsule by mouth daily before breakfast. 07/11/19 08/09/19  Shelly Bombard, MD    Family History Family History  Problem Relation Age of Onset  . Asthma Other   . Hypertension Other   . Hyperlipidemia Other   . Sleep apnea Other   . Stroke Other   . Heart attack Other     Social History Social History   Tobacco Use  . Smoking status: Current Some Day Smoker    Types: Cigars  . Smokeless tobacco: Never Used  . Tobacco comment: black and milds  Substance Use Topics  . Alcohol use: No  . Drug use: No     Allergies   Patient has no known allergies.   Review of Systems Review of Systems  Constitutional: Positive for fatigue. Negative for activity change, appetite change, chills and fever.  HENT: Positive for congestion, rhinorrhea and sore throat. Negative for ear pain, sinus pressure and trouble swallowing.   Eyes: Negative for discharge and redness.  Respiratory: Positive for cough. Negative for chest tightness and shortness  of breath.   Cardiovascular: Negative for chest pain.  Gastrointestinal: Negative for abdominal pain, diarrhea, nausea and vomiting.  Musculoskeletal: Negative for myalgias.  Skin: Negative for rash.  Neurological: Negative for dizziness, light-headedness and headaches.     Physical Exam Triage Vital Signs ED Triage Vitals  Enc Vitals Group     BP 08/09/19 1406 131/86     Pulse Rate 08/09/19 1406 (!) 101     Resp 08/09/19 1406 18     Temp 08/09/19 1406 99.8 F (37.7 C)     Temp Source 08/09/19 1406 Oral     SpO2 08/09/19 1406 98 %     Weight --      Height --      Head Circumference --      Peak Flow --      Pain Score 08/09/19 1405 0     Pain Loc --      Pain Edu? --      Excl. in GC? --    No data found.  Updated Vital Signs BP 131/86 (BP Location: Right Wrist)   Pulse  (!) 101   Temp 99.8 F (37.7 C) (Oral)   Resp 18   SpO2 98%   Visual Acuity Right Eye Distance:   Left Eye Distance:   Bilateral Distance:    Right Eye Near:   Left Eye Near:    Bilateral Near:     Physical Exam Vitals and nursing note reviewed.  Constitutional:      General: She is not in acute distress.    Appearance: She is well-developed.  HENT:     Head: Normocephalic and atraumatic.     Ears:     Comments: Bilateral ears without tenderness to palpation of external auricle, tragus and mastoid, EAC's without erythema or swelling, TM's with good bony landmarks and cone of light. Non erythematous.     Mouth/Throat:     Comments: Oral mucosa pink and moist, no tonsillar enlargement or exudate. Posterior pharynx patent and nonerythematous, no uvula deviation or swelling. Normal phonation. Eyes:     Conjunctiva/sclera: Conjunctivae normal.  Neck:     Comments: Full active ROM of neck Cardiovascular:     Rate and Rhythm: Regular rhythm. Tachycardia present.     Heart sounds: No murmur.  Pulmonary:     Effort: Pulmonary effort is normal. No respiratory distress.     Breath sounds: Normal breath sounds.     Comments: Breathing comfortably at rest, CTABL, no wheezing, rales or other adventitious sounds auscultated Abdominal:     Palpations: Abdomen is soft.     Tenderness: There is no abdominal tenderness.  Musculoskeletal:     Cervical back: Neck supple.  Skin:    General: Skin is warm and dry.  Neurological:     Mental Status: She is alert.      UC Treatments / Results  Labs (all labs ordered are listed, but only abnormal results are displayed) Labs Reviewed  NOVEL CORONAVIRUS, NAA (HOSP ORDER, SEND-OUT TO REF LAB; TAT 18-24 HRS)  POC SARS CORONAVIRUS 2 AG -  ED  POC SARS CORONAVIRUS 2 AG    EKG   Radiology No results found.  Procedures Procedures (including critical care time)  Medications Ordered in UC Medications - No data to display  Initial  Impression / Assessment and Plan / UC Course  I have reviewed the triage vital signs and the nursing notes.  Pertinent labs & imaging results that were available during my care  of the patient were reviewed by me and considered in my medical decision making (see chart for details).     Point-of-care Covid negative, PCR pending.  Discussed with patient quarantining until second swab results return.  Likely viral etiology.  Recommending continued symptomatic and supportive care.  Lungs clear at this time.  Provided Zyrtec and Flonase for congestion/drainage and ear discomfort.  Cough syrup as needed for further congestion cough.  Rest, push fluids.  Continue to monitor,Discussed strict return precautions. Patient verbalized understanding and is agreeable with plan.  Final Clinical Impressions(s) / UC Diagnoses   Final diagnoses:  Viral URI with cough     Discharge Instructions     Rapid covid negative, COVID PCR pending. Please quarantine while second Covid test results return Begin daily cetirizine and Flonase nasal spray 1 to 2 spray in each nostril to help with congestion, drainage and ear discomfort May use cough syrup as needed for further congestion and cough Please rest and drink plenty of fluids Tylenol and ibuprofen for fevers, body aches, headache Please follow-up if symptoms not resolving or worsening, developing difficulty breathing or shortness of breath    ED Prescriptions    Medication Sig Dispense Auth. Provider   fluticasone (FLONASE) 50 MCG/ACT nasal spray Place 1-2 sprays into both nostrils daily for 7 days. 1 g Tamarcus Condie C, PA-C   Cetirizine HCl 10 MG CAPS Take 1 capsule (10 mg total) by mouth daily. 15 capsule Katlin Bortner C, PA-C   brompheniramine-pseudoephedrine-DM 30-2-10 MG/5ML syrup Take 5 mLs by mouth 4 (four) times daily as needed. 120 mL Chase Arnall, [place] C, PA-C     PDMP not reviewed this encounter.   Lew DawesWieters, Cadyn Rodger C, PA-C 08/09/19 1451

## 2019-08-09 NOTE — Discharge Instructions (Signed)
Rapid covid negative, COVID PCR pending. Please quarantine while second Covid test results return Begin daily cetirizine and Flonase nasal spray 1 to 2 spray in each nostril to help with congestion, drainage and ear discomfort May use cough syrup as needed for further congestion and cough Please rest and drink plenty of fluids Tylenol and ibuprofen for fevers, body aches, headache Please follow-up if symptoms not resolving or worsening, developing difficulty breathing or shortness of breath

## 2019-08-10 LAB — NOVEL CORONAVIRUS, NAA (HOSP ORDER, SEND-OUT TO REF LAB; TAT 18-24 HRS): SARS-CoV-2, NAA: NOT DETECTED

## 2019-08-27 ENCOUNTER — Ambulatory Visit: Payer: Medicaid Other

## 2019-08-27 ENCOUNTER — Other Ambulatory Visit: Payer: Self-pay

## 2019-08-27 ENCOUNTER — Ambulatory Visit (INDEPENDENT_AMBULATORY_CARE_PROVIDER_SITE_OTHER): Payer: Medicaid Other

## 2019-08-27 DIAGNOSIS — Z3042 Encounter for surveillance of injectable contraceptive: Secondary | ICD-10-CM

## 2019-08-27 NOTE — Progress Notes (Signed)
Patient seen and assessed by nursing staff during this encounter. I have reviewed the chart and agree with the documentation and plan.  Catalina Antigua, MD 08/27/2019 3:14 PM

## 2019-08-27 NOTE — Progress Notes (Addendum)
Nurse visit for Depo Pt is within her window Depo given L Del without complaints Next Depo due Mar 22 - Apr 5, pt agrees

## 2019-09-03 ENCOUNTER — Encounter: Payer: Self-pay | Admitting: Obstetrics

## 2019-09-03 ENCOUNTER — Other Ambulatory Visit: Payer: Self-pay

## 2019-09-03 ENCOUNTER — Other Ambulatory Visit (HOSPITAL_COMMUNITY)
Admission: RE | Admit: 2019-09-03 | Discharge: 2019-09-03 | Disposition: A | Discharge status: Home / Self Care | Payer: Medicaid Other | Source: Ambulatory Visit | Attending: Obstetrics | Admitting: Obstetrics

## 2019-09-03 ENCOUNTER — Ambulatory Visit (INDEPENDENT_AMBULATORY_CARE_PROVIDER_SITE_OTHER): Payer: Medicaid Other | Admitting: Obstetrics

## 2019-09-03 VITALS — BP 121/80 | HR 74 | Wt 364.0 lb

## 2019-09-03 DIAGNOSIS — Z3042 Encounter for surveillance of injectable contraceptive: Secondary | ICD-10-CM

## 2019-09-03 DIAGNOSIS — R87612 Low grade squamous intraepithelial lesion on cytologic smear of cervix (LGSIL): Secondary | ICD-10-CM | POA: Diagnosis not present

## 2019-09-03 DIAGNOSIS — N760 Acute vaginitis: Secondary | ICD-10-CM | POA: Insufficient documentation

## 2019-09-03 DIAGNOSIS — N898 Other specified noninflammatory disorders of vagina: Secondary | ICD-10-CM

## 2019-09-03 DIAGNOSIS — Z01419 Encounter for gynecological examination (general) (routine) without abnormal findings: Secondary | ICD-10-CM | POA: Diagnosis not present

## 2019-09-03 DIAGNOSIS — Z0000 Encounter for general adult medical examination without abnormal findings: Secondary | ICD-10-CM

## 2019-09-03 DIAGNOSIS — F17200 Nicotine dependence, unspecified, uncomplicated: Secondary | ICD-10-CM

## 2019-09-03 DIAGNOSIS — E66813 Obesity, class 3: Secondary | ICD-10-CM

## 2019-09-03 DIAGNOSIS — Z6841 Body Mass Index (BMI) 40.0 and over, adult: Secondary | ICD-10-CM

## 2019-09-03 MED ORDER — TINIDAZOLE 500 MG PO TABS: 1000.0000 mg | ORAL_TABLET | Freq: Every day | ORAL | 2 refills | Status: DC

## 2019-09-03 NOTE — Progress Notes (Signed)
Subjective:        Patricia Duran is a 24 y.o. female here for a routine exam.  Current complaints: None.    Personal health questionnaire:  Is patient Ashkenazi Jewish, have a family history of breast and/or ovarian cancer: no Is there a family history of uterine cancer diagnosed at age < 35, gastrointestinal cancer, urinary tract cancer, family member who is a Field seismologist syndrome-associated carrier: no Is the patient overweight and hypertensive, family history of diabetes, personal history of gestational diabetes, preeclampsia or PCOS: no Is patient over 26, have PCOS,  family history of premature CHD under age 58, diabetes, smoke, have hypertension or peripheral artery disease:  no At any time, has a partner hit, kicked or otherwise hurt or frightened you?: no Over the past 2 weeks, have you felt down, depressed or hopeless?: no Over the past 2 weeks, have you felt little interest or pleasure in doing things?:no   Gynecologic History No LMP recorded. Patient has had an injection. Contraception: abstinence Last Pap: none. Results were: none Last mammogram: n/a. Results were: n/a  Obstetric History OB History  Gravida Para Term Preterm AB Living  0 0 0 0 0 0  SAB TAB Ectopic Multiple Live Births  0 0 0 0 0    Past Medical History:  Diagnosis Date  . Asthma   . Obesity     Past Surgical History:  Procedure Laterality Date  . TOE SURGERY       Current Outpatient Medications:  .  albuterol (PROVENTIL HFA;VENTOLIN HFA) 108 (90 BASE) MCG/ACT inhaler, Inhale 1 puff into the lungs every 6 (six) hours as needed for wheezing or shortness of breath., Disp: , Rfl:  .  brompheniramine-pseudoephedrine-DM 30-2-10 MG/5ML syrup, Take 5 mLs by mouth 4 (four) times daily as needed. (Patient not taking: Reported on 08/27/2019), Disp: 120 mL, Rfl: 0 .  Cetirizine HCl 10 MG CAPS, Take 1 capsule (10 mg total) by mouth daily. (Patient not taking: Reported on 08/27/2019), Disp: 15 capsule, Rfl:  0 .  fluticasone (FLONASE) 50 MCG/ACT nasal spray, Place 1-2 sprays into both nostrils daily for 7 days., Disp: 1 g, Rfl: 0 .  medroxyPROGESTERone (DEPO-PROVERA) 150 MG/ML injection, Inject 1 mL (150 mg total) into the muscle every 3 (three) months., Disp: 1 mL, Rfl: 4 .  tinidazole (TINDAMAX) 500 MG tablet, Take 2 tablets (1,000 mg total) by mouth daily with breakfast., Disp: 10 tablet, Rfl: 2  Current Facility-Administered Medications:  .  medroxyPROGESTERone (DEPO-PROVERA) injection 150 mg, 150 mg, Intramuscular, Q90 days, Chancy Milroy, MD, 150 mg at 08/27/19 1125 No Known Allergies  Social History   Tobacco Use  . Smoking status: Current Some Day Smoker    Types: Cigars  . Smokeless tobacco: Never Used  . Tobacco comment: black and milds  Substance Use Topics  . Alcohol use: No    Family History  Problem Relation Age of Onset  . Asthma Other   . Hypertension Other   . Hyperlipidemia Other   . Sleep apnea Other   . Stroke Other   . Heart attack Other       Review of Systems  Constitutional: negative for fatigue and weight loss Respiratory: negative for cough and wheezing Cardiovascular: negative for chest pain, fatigue and palpitations Gastrointestinal: negative for abdominal pain and change in bowel habits Musculoskeletal:negative for myalgias Neurological: negative for gait problems and tremors Behavioral/Psych: negative for abusive relationship, depression Endocrine: negative for temperature intolerance    Genitourinary:negative for  abnormal menstrual periods, genital lesions, hot flashes, sexual problems and vaginal discharge Integument/breast: negative for breast lump, breast tenderness, nipple discharge and skin lesion(s)    Objective:       BP 121/80   Pulse 74   Wt (!) 364 lb (165.1 kg)   BMI 53.75 kg/m  General:   alert  Skin:   no rash or abnormalities  Lungs:   clear to auscultation bilaterally  Heart:   regular rate and rhythm, S1, S2 normal,  no murmur, click, rub or gallop  Breasts:   normal without suspicious masses, skin or nipple changes or axillary nodes  Abdomen:  normal findings: no organomegaly, soft, non-tender and no hernia  Pelvis:  External genitalia: normal general appearance Urinary system: urethral meatus normal and bladder without fullness, nontender Vaginal: normal without tenderness, induration or masses Cervix: normal appearance Adnexa: normal bimanual exam Uterus: anteverted and non-tender, normal size   Lab Review Urine pregnancy test Labs reviewed yes Radiologic studies reviewed no  50% of 25 min visit spent on counseling and coordination of care.   Assessment:     1. Encounter for gynecological examination with Papanicolaou smear of cervix Rx: - Cytology - PAP( [facility])  2. Vaginal discharge Rx: - Cervicovaginal ancillary only( [place]) - tinidazole (TINDAMAX) 500 MG tablet; Take 2 tablets (1,000 mg total) by mouth daily with breakfast.  Dispense: 10 tablet; Refill: 2  3. Encounter for surveillance of injectable contraceptive - pleased with Dep  4. Tobacco dependence - tobacco cessation with the aid of medication and behavioral modification recommended  5. Class 3 severe obesity due to excess calories without serious comorbidity with body mass index (BMI) of 50.0 to 59.9 in adult [facility]) - program of caloric restriction, exercise and behavioral modification recommended    Plan:    Education reviewed: calcium supplements, depression evaluation, low fat, low cholesterol diet, safe sex/STD prevention, self breast exams, smoking cessation and weight bearing exercise. Contraception: OCP (estrogen/progesterone). Follow up in: 1 year.   Meds ordered this encounter  Medications  . tinidazole (TINDAMAX) 500 MG tablet    Sig: Take 2 tablets (1,000 mg total) by mouth daily with breakfast.    Dispense:  10 tablet    Refill:  2     Brock Bad, MD 09/03/2019 12:07 PM

## 2019-09-04 ENCOUNTER — Other Ambulatory Visit: Payer: Self-pay | Admitting: Obstetrics

## 2019-09-04 LAB — CERVICOVAGINAL ANCILLARY ONLY
Bacterial Vaginitis (gardnerella): POSITIVE — AB
Candida Glabrata: NEGATIVE
Candida Vaginitis: NEGATIVE
Chlamydia: NEGATIVE
Comment: NEGATIVE
Comment: NEGATIVE
Comment: NEGATIVE
Comment: NEGATIVE
Comment: NEGATIVE
Comment: NORMAL
Neisseria Gonorrhea: NEGATIVE
Trichomonas: NEGATIVE

## 2019-09-07 LAB — CYTOLOGY - PAP

## 2019-09-14 DIAGNOSIS — M545 Low back pain: Secondary | ICD-10-CM | POA: Diagnosis not present

## 2019-09-17 ENCOUNTER — Encounter: Payer: Medicaid Other | Admitting: Obstetrics

## 2019-11-05 ENCOUNTER — Ambulatory Visit (HOSPITAL_COMMUNITY)
Admission: EM | Admit: 2019-11-05 | Discharge: 2019-11-05 | Disposition: A | Discharge status: Home / Self Care | Payer: Medicaid Other | Attending: Family Medicine | Admitting: Family Medicine

## 2019-11-05 ENCOUNTER — Other Ambulatory Visit: Payer: Self-pay

## 2019-11-05 ENCOUNTER — Encounter (HOSPITAL_COMMUNITY): Payer: Self-pay | Admitting: Family Medicine

## 2019-11-05 DIAGNOSIS — U071 COVID-19: Secondary | ICD-10-CM | POA: Insufficient documentation

## 2019-11-05 NOTE — ED Triage Notes (Addendum)
Pt is here with a loss of smell on Wednesday she thinks its her sinuses & her left side lymph nodes are swollen that started Thursday, pt has taken cold & flu meds to relieve discomfort.

## 2019-11-05 NOTE — Discharge Instructions (Signed)
This is most likely some sort of viral illness Possible covid  Make sure that you are taking quarantine precautions until we get the results.  You can take over-the-counter medications for your symptoms. Recommend Zyrtec and Flonase.  Also recommend hot showers Work note given

## 2019-11-06 LAB — SARS CORONAVIRUS 2 (TAT 6-24 HRS): SARS Coronavirus 2: POSITIVE — AB

## 2019-11-06 NOTE — ED Provider Notes (Signed)
[facility]    CSN: [id number] Arrival date & time: 11/05/19  1528      History   Chief Complaint Chief Complaint  Patient presents with  . Loss of smell  . Sore Throat    HPI TELESIA ATES is a 24 y.o. female.   Patient is a 24 year old female past medical history of asthma and obesity.  She presents today with loss of taste and smell, nasal congestion, rhinorrhea, lymphadenopathy.  Symptoms have been constant, waxing waning x5 days.  She has been taking over-the-counter cold and flu medication with some relief.  She does have a history of seasonal allergies.  Recent sick contacts at work.  No chest pain or shortness of breath.  No fever, chills, body aches or night sweats.  ROS per HPI      Past Medical History:  Diagnosis Date  . Asthma   . Obesity     Patient Active Problem List   Diagnosis Date Noted  . Dysmenorrhea 05/27/2016    Past Surgical History:  Procedure Laterality Date  . TOE SURGERY      OB History    Gravida  0   Para  0   Term  0   Preterm  0   AB  0   Living  0     SAB  0   TAB  0   Ectopic  0   Multiple  0   Live Births  0            Home Medications    Prior to Admission medications   Medication Sig Start Date End Date Taking? Authorizing Provider  albuterol (PROVENTIL HFA;VENTOLIN HFA) 108 (90 BASE) MCG/ACT inhaler Inhale 1 puff into the lungs every 6 (six) hours as needed for wheezing or shortness of breath.    [provider]  cetirizine (ZYRTEC) 10 MG tablet Take 10 mg by mouth daily. 09/14/19   [provider]  cyclobenzaprine (FLEXERIL) 5 MG tablet Take 5-10 mg by mouth at bedtime as needed. 09/14/19   [provider]  fluticasone (FLONASE) 50 MCG/ACT nasal spray Place 1-2 sprays into both nostrils daily for 7 days. 08/09/19 08/16/19  Wieters, Hallie C, PA-C  medroxyPROGESTERone (DEPO-PROVERA) 150 MG/ML injection Inject 1 mL (150 mg total) into the muscle every 3  (three) months. 08/29/18   Chancy Milroy, MD  methocarbamol (ROBAXIN) 500 MG tablet Take 500 mg by mouth every 6 (six) hours as needed. 10/07/19   [provider]  metroNIDAZOLE (FLAGYL) 500 MG tablet Take 500 mg by mouth 2 (two) times daily. 09/14/19   [provider]  MOBIC 15 MG tablet Take 15 mg by mouth daily as needed. 09/14/19   [provider]  tinidazole (TINDAMAX) 500 MG tablet Take 2 tablets (1,000 mg total) by mouth daily with breakfast. 09/03/19   Shelly Bombard, MD  Norethin Ace-Eth Estrad-FE (TAYTULLA) 1-20 MG-MCG(24) CAPS Take 1 capsule by mouth daily before breakfast. 07/11/19 08/09/19  Shelly Bombard, MD    Family History Family History  Problem Relation Age of Onset  . Asthma Other   . Hypertension Other   . Hyperlipidemia Other   . Sleep apnea Other   . Stroke Other   . Heart attack Other   . Hypertension Mother   . Asthma Mother   . Hypertension Father     Social History Social History   Tobacco Use  . Smoking status: Current Some Day Smoker  Types: Cigars  . Smokeless tobacco: Never Used  . Tobacco comment: black and milds  Substance Use Topics  . Alcohol use: No  . Drug use: No     Allergies   Patient has no known allergies.   Review of Systems Review of Systems   Physical Exam Triage Vital Signs ED Triage Vitals  Enc Vitals Group     BP 11/05/19 1626 131/85     Pulse Rate 11/05/19 1626 87     Resp 11/05/19 1626 (!) 22     Temp 11/05/19 1626 99 F (37.2 C)     Temp Source 11/05/19 1626 Oral     SpO2 11/05/19 1626 99 %     Weight 11/05/19 1622 (!) 381 lb (172.8 kg)     Height --      Head Circumference --      Peak Flow --      Pain Score 11/05/19 1622 0     Pain Loc --      Pain Edu? --      Excl. in GC? --    No data found.  Updated Vital Signs BP 131/85 (BP Location: Left Arm)   Pulse 87   Temp 99 F (37.2 C) (Oral)   Resp (!) 22   Wt (!) 381 lb (172.8 kg)   SpO2 99%   BMI 56.26 kg/m    Visual Acuity Right Eye Distance:   Left Eye Distance:   Bilateral Distance:    Right Eye Near:   Left Eye Near:    Bilateral Near:     Physical Exam Vitals and nursing note reviewed.  Constitutional:      General: She is not in acute distress.    Appearance: Normal appearance. She is not ill-appearing, toxic-appearing or diaphoretic.  HENT:     Head: Normocephalic.     Right Ear: Tympanic membrane and ear canal normal.     Left Ear: Tympanic membrane and ear canal normal.     Nose: Congestion and rhinorrhea present.     Mouth/Throat:     Pharynx: Oropharynx is clear.  Eyes:     Conjunctiva/sclera: Conjunctivae normal.  Cardiovascular:     Rate and Rhythm: Normal rate and regular rhythm.  Pulmonary:     Effort: Pulmonary effort is normal.     Breath sounds: Normal breath sounds.  Abdominal:     Palpations: Abdomen is soft.  Musculoskeletal:        General: Normal range of motion.     Cervical back: Normal range of motion.  Skin:    General: Skin is warm and dry.  Neurological:     Mental Status: She is alert.  Psychiatric:        Mood and Affect: Mood normal.      UC Treatments / Results  Labs (all labs ordered are listed, but only abnormal results are displayed) Labs Reviewed  SARS CORONAVIRUS 2 (TAT 6-24 HRS) - Abnormal; Notable for the following components:      Result Value   SARS Coronavirus 2 POSITIVE (*)    All other components within normal limits    EKG   Radiology No results found.  Procedures Procedures (including critical care time)  Medications Ordered in UC Medications - No data to display  Initial Impression / Assessment and Plan / UC Course  I have reviewed the triage vital signs and the nursing notes.  Pertinent labs & imaging results that were available during my care of the patient  were reviewed by me and considered in my medical decision making (see chart for details).     COVID-19-Covid test positive. Quarantine  instructions and work note given. Over-the-counter medications as needed for symptoms. Patient called to relay results and left voicemail due to no answer. Final Clinical Impressions(s) / UC Diagnoses   Final diagnoses:  COVID-19     Discharge Instructions     This is most likely some sort of viral illness Possible covid  Make sure that you are taking quarantine precautions until we get the results.  You can take over-the-counter medications for your symptoms. Recommend Zyrtec and Flonase.  Also recommend hot showers Work note given     ED Prescriptions    None     PDMP not reviewed this encounter.   Dahlia Byes A, NP 11/06/19 1020

## 2019-11-07 ENCOUNTER — Telehealth: Payer: Self-pay | Admitting: Unknown Physician Specialty

## 2019-11-07 NOTE — Telephone Encounter (Signed)
Called to discuss with patient about Covid symptoms and the use of bamlanivimab, a monoclonal antibody infusion for those with mild to moderate Covid symptoms and at a high risk of hospitalization.  Pt is qualified for this infusion at the [facility] due to BMI>35   Message left to call back  

## 2019-11-21 ENCOUNTER — Other Ambulatory Visit: Payer: Self-pay | Admitting: Obstetrics and Gynecology

## 2019-11-21 ENCOUNTER — Ambulatory Visit: Payer: Medicaid Other

## 2019-11-21 ENCOUNTER — Ambulatory Visit (INDEPENDENT_AMBULATORY_CARE_PROVIDER_SITE_OTHER): Payer: Medicaid Other

## 2019-11-21 ENCOUNTER — Other Ambulatory Visit: Payer: Self-pay

## 2019-11-21 VITALS — Ht 69.0 in | Wt 381.0 lb

## 2019-11-21 DIAGNOSIS — N946 Dysmenorrhea, unspecified: Secondary | ICD-10-CM

## 2019-11-21 DIAGNOSIS — Z3042 Encounter for surveillance of injectable contraceptive: Secondary | ICD-10-CM | POA: Diagnosis not present

## 2019-11-21 DIAGNOSIS — Z20828 Contact with and (suspected) exposure to other viral communicable diseases: Secondary | ICD-10-CM | POA: Diagnosis not present

## 2019-11-21 DIAGNOSIS — Z03818 Encounter for observation for suspected exposure to other biological agents ruled out: Secondary | ICD-10-CM | POA: Diagnosis not present

## 2019-11-21 MED ORDER — MEDROXYPROGESTERONE ACETATE 150 MG/ML IM SUSP
150.0000 mg | Freq: Once | INTRAMUSCULAR | Status: AC
  Administered 2019-11-21: 150 mg via INTRAMUSCULAR

## 2019-11-21 NOTE — Progress Notes (Signed)
Presents for DEPO Injection, given in RD, tolerated well.  Next DEPO June 16-30/2021  Administrations This Visit    medroxyPROGESTERone (DEPO-PROVERA) injection 150 mg    Admin Date 11/21/2019 Action Given Dose 150 mg Route Intramuscular Administered By Maretta Bees, RMA

## 2020-01-02 ENCOUNTER — Ambulatory Visit (HOSPITAL_COMMUNITY)
Admission: EM | Admit: 2020-01-02 | Discharge: 2020-01-02 | Disposition: A | Discharge status: Home / Self Care | Payer: Medicaid Other | Attending: Family Medicine | Admitting: Family Medicine

## 2020-01-02 ENCOUNTER — Encounter (HOSPITAL_COMMUNITY): Payer: Self-pay

## 2020-01-02 ENCOUNTER — Other Ambulatory Visit: Payer: Self-pay

## 2020-01-02 DIAGNOSIS — Z113 Encounter for screening for infections with a predominantly sexual mode of transmission: Secondary | ICD-10-CM | POA: Diagnosis not present

## 2020-01-02 DIAGNOSIS — R300 Dysuria: Secondary | ICD-10-CM | POA: Diagnosis not present

## 2020-01-02 LAB — POCT URINALYSIS DIP (DEVICE)
Bilirubin Urine: NEGATIVE
Glucose, UA: NEGATIVE mg/dL
Hgb urine dipstick: NEGATIVE
Ketones, ur: NEGATIVE mg/dL
Nitrite: NEGATIVE
Protein, ur: NEGATIVE mg/dL
Specific Gravity, Urine: 1.025 (ref 1.005–1.030)
Urobilinogen, UA: 0.2 mg/dL (ref 0.0–1.0)
pH: 7.5 (ref 5.0–8.0)

## 2020-01-02 MED ORDER — PHENAZOPYRIDINE HCL 100 MG PO TABS: 100.0000 mg | ORAL_TABLET | Freq: Three times a day (TID) | ORAL | 0 refills | Status: DC | PRN

## 2020-01-02 NOTE — ED Provider Notes (Signed)
[facility]   CC: Frequent urination and burning  SUBJECTIVE:  Patricia Duran is a 24 y.o. female who complains of urinary frequency, urgency and dysuria that started yesterday.  Patient denies a precipitating event, recent sexual encounter, excessive caffeine intake.  Denies abdomen/ flank pain.    Has tried OTC medications without relief.  Symptoms are made worse with urination.  Admits to similar symptoms in the past and was diagnosed with UTI.  Denies fever, chills, nausea, vomiting, abdominal pain, flank pain, abnormal vaginal discharge or bleeding, hematuria.    LMP: No LMP recorded. Patient has had an injection.  ROS: As in HPI.  All other pertinent ROS negative.     Past Medical History:  Diagnosis Date  . Asthma   . Obesity    Past Surgical History:  Procedure Laterality Date  . TOE SURGERY     No Known Allergies Current Facility-Administered Medications on File Prior to Encounter  Medication Dose Route Frequency Provider Last Rate Last Admin  . medroxyPROGESTERone (DEPO-PROVERA) injection 150 mg  150 mg Intramuscular Q90 days Chancy Milroy, MD   150 mg at 08/27/19 1125   Current Outpatient Medications on File Prior to Encounter  Medication Sig Dispense Refill  . albuterol (PROVENTIL HFA;VENTOLIN HFA) 108 (90 BASE) MCG/ACT inhaler Inhale 1 puff into the lungs every 6 (six) hours as needed for wheezing or shortness of breath.    . cetirizine (ZYRTEC) 10 MG tablet Take 10 mg by mouth daily.    . cyclobenzaprine (FLEXERIL) 5 MG tablet Take 5-10 mg by mouth at bedtime as needed.    . fluticasone (FLONASE) 50 MCG/ACT nasal spray Place 1-2 sprays into both nostrils daily for 7 days. 1 g 0  . medroxyPROGESTERone (DEPO-PROVERA) 150 MG/ML injection INJECT 1ML IN THE MUSCLE EVERY 3 MONTHS 1 mL 4  . methocarbamol (ROBAXIN) 500 MG tablet Take 500 mg by mouth every 6 (six) hours as needed.    . metroNIDAZOLE (FLAGYL) 500 MG tablet Take 500 mg by mouth 2 (two) times  daily.    Marland Kitchen MOBIC 15 MG tablet Take 15 mg by mouth daily as needed.    . tinidazole (TINDAMAX) 500 MG tablet Take 2 tablets (1,000 mg total) by mouth daily with breakfast. 10 tablet 2  . [DISCONTINUED] Norethin Ace-Eth Estrad-FE (TAYTULLA) 1-20 MG-MCG(24) CAPS Take 1 capsule by mouth daily before breakfast. 28 capsule 0   Social History   Socioeconomic History  . Marital status: Single    Spouse name: Not on file  . Number of children: Not on file  . Years of education: Not on file  . Highest education level: Not on file  Occupational History  . Not on file  Tobacco Use  . Smoking status: Current Some Day Smoker    Types: Cigars  . Smokeless tobacco: Never Used  . Tobacco comment: black and milds  Substance and Sexual Activity  . Alcohol use: No  . Drug use: No  . Sexual activity: Yes    Birth control/protection: Injection  Other Topics Concern  . Not on file  Social History Narrative  . Not on file   Social Determinants of Health   Financial Resource Strain:   . Difficulty of Paying Living Expenses:   Food Insecurity:   . Worried About Charity fundraiser in the Last Year:   . Arboriculturist in the Last Year:   Transportation Needs:   . Film/video editor (Medical):   Marland Kitchen Lack  of Transportation (Non-Medical):   Physical Activity:   . Days of Exercise per Week:   . Minutes of Exercise per Session:   Stress:   . Feeling of Stress :   Social Connections:   . Frequency of Communication with Friends and Family:   . Frequency of Social Gatherings with Friends and Family:   . Attends Religious Services:   . Active Member of Clubs or Organizations:   . Attends Banker Meetings:   Marland Kitchen Marital Status:   Intimate Partner Violence:   . Fear of Current or Ex-Partner:   . Emotionally Abused:   Marland Kitchen Physically Abused:   . Sexually Abused:    Family History  Problem Relation Age of Onset  . Asthma Other   . Hypertension Other   . Hyperlipidemia Other   .  Sleep apnea Other   . Stroke Other   . Heart attack Other   . Hypertension Mother   . Asthma Mother   . Hypertension Father     OBJECTIVE:  Vitals:   01/02/20 0835  BP: 112/77  Pulse: 87  Resp: 16  Temp: 98 F (36.7 C)  TempSrc: Oral  SpO2: 98%   General appearance: AOx3 in no acute distress HEENT: NCAT.  Oropharynx clear.  Lungs: clear to auscultation bilaterally without adventitious breath sounds Heart: regular rate and rhythm.  Radial pulses 2+ symmetrical bilaterally Abdomen: soft; non-distended; no tenderness; bowel sounds present; no guarding or rebound tenderness Back: no CVA tenderness Extremities: no edema; symmetrical with no gross deformities Skin: warm and dry Neurologic: Ambulates from chair to exam table without difficulty Psychological: alert and cooperative; normal mood and affect  Labs Reviewed  POCT URINALYSIS DIP (DEVICE) - Abnormal; Notable for the following components:      Result Value   Leukocytes,Ua TRACE (*)    All other components within normal limits  URINE CULTURE  CERVICOVAGINAL ANCILLARY ONLY    ASSESSMENT & PLAN:  1. Dysuria   2. Screening for STD (sexually transmitted disease)     Meds ordered this encounter  Medications  . phenazopyridine (PYRIDIUM) 100 MG tablet    Sig: Take 1 tablet (100 mg total) by mouth 3 (three) times daily as needed for pain.    Dispense:  10 tablet    Refill:  0   Discharge instruction POCT urine analysis showed trace of white blood cell, inconclusive for UTI Urine culture sent.  We will call you with the results.   vaginal self swab was obtained Push fluids and get plenty of rest.   Follow up with PCP if symptoms persists Return here or go to ER if you have any new or worsening symptoms such as fever, worsening abdominal pain, nausea/vomiting, flank pain, etc...  Outlined signs and symptoms indicating need for more acute intervention. Patient verbalized understanding. After Visit Summary  given.     Durward Parcel, FNP 01/02/20 [phone]

## 2020-01-02 NOTE — ED Triage Notes (Signed)
Patient reports increased urinary frequency since yesterday.

## 2020-01-02 NOTE — Discharge Instructions (Addendum)
POCT urine analysis showed trace of white blood cell, inconclusive for UTI Urine culture sent.  We will call you with the results.   vaginal self swab was obtained Push fluids and get plenty of rest.   Follow up with PCP if symptoms persists Return here or go to ER if you have any new or worsening symptoms such as fever, worsening abdominal pain, nausea/vomiting, flank pain, etc..Marland Kitchen

## 2020-01-03 ENCOUNTER — Telehealth (HOSPITAL_COMMUNITY): Payer: Self-pay

## 2020-01-03 LAB — CERVICOVAGINAL ANCILLARY ONLY
Bacterial Vaginitis (gardnerella): POSITIVE — AB
Candida Glabrata: NEGATIVE
Candida Vaginitis: NEGATIVE
Chlamydia: NEGATIVE
Comment: NEGATIVE
Comment: NEGATIVE
Comment: NEGATIVE
Comment: NEGATIVE
Comment: NEGATIVE
Comment: NORMAL
Neisseria Gonorrhea: NEGATIVE
Trichomonas: NEGATIVE

## 2020-01-04 ENCOUNTER — Telehealth (HOSPITAL_COMMUNITY): Payer: Self-pay

## 2020-01-04 LAB — URINE CULTURE: Culture: >=100000 — AB

## 2020-01-04 MED ORDER — NITROFURANTOIN MONOHYD MACRO 100 MG PO CAPS: 100.0000 mg | ORAL_CAPSULE | Freq: Two times a day (BID) | ORAL | 0 refills | Status: DC

## 2020-01-04 NOTE — Telephone Encounter (Signed)
Bacterial vaginosis is positive. Pt needs treatment. Flagyl 500 mg BID x 7 days #14 no refills sent to patients pharmacy of choice by urgent care staff.  Urine culture positive. Macrobid called into patients pharmacy.

## 2020-02-07 ENCOUNTER — Ambulatory Visit: Payer: Medicaid Other

## 2020-02-08 DIAGNOSIS — Z6841 Body Mass Index (BMI) 40.0 and over, adult: Secondary | ICD-10-CM | POA: Diagnosis not present

## 2020-02-08 DIAGNOSIS — M545 Low back pain: Secondary | ICD-10-CM | POA: Diagnosis not present

## 2020-02-11 ENCOUNTER — Ambulatory Visit: Payer: Medicaid Other

## 2020-02-18 ENCOUNTER — Other Ambulatory Visit: Payer: Self-pay

## 2020-02-18 ENCOUNTER — Ambulatory Visit (INDEPENDENT_AMBULATORY_CARE_PROVIDER_SITE_OTHER): Payer: Medicaid Other

## 2020-02-18 DIAGNOSIS — Z3042 Encounter for surveillance of injectable contraceptive: Secondary | ICD-10-CM

## 2020-02-18 NOTE — Progress Notes (Signed)
Nurse visit presents for pt supply Depo given LD without difficulty Next Depo due Sept 13-27, pt agrees

## 2020-02-18 NOTE — Progress Notes (Signed)
Patient was assessed and managed by nursing staff during this encounter. I have reviewed the chart and agree with the documentation and plan. I have also made any necessary editorial changes.  Catalina Antigua, MD 02/18/2020 3:55 PM

## 2020-03-07 ENCOUNTER — Encounter (HOSPITAL_COMMUNITY): Payer: Self-pay

## 2020-03-07 ENCOUNTER — Ambulatory Visit (HOSPITAL_COMMUNITY)
Admission: EM | Admit: 2020-03-07 | Discharge: 2020-03-07 | Disposition: A | Discharge status: Home / Self Care | Payer: Medicaid Other | Attending: Urgent Care | Admitting: Urgent Care

## 2020-03-07 DIAGNOSIS — Z3202 Encounter for pregnancy test, result negative: Secondary | ICD-10-CM | POA: Diagnosis not present

## 2020-03-07 DIAGNOSIS — R350 Frequency of micturition: Secondary | ICD-10-CM | POA: Diagnosis not present

## 2020-03-07 DIAGNOSIS — N390 Urinary tract infection, site not specified: Secondary | ICD-10-CM

## 2020-03-07 LAB — POCT URINALYSIS DIP (DEVICE)
Bilirubin Urine: NEGATIVE
Glucose, UA: NEGATIVE mg/dL
Ketones, ur: NEGATIVE mg/dL
Nitrite: NEGATIVE
Protein, ur: NEGATIVE mg/dL
Specific Gravity, Urine: 1.025 (ref 1.005–1.030)
Urobilinogen, UA: 0.2 mg/dL (ref 0.0–1.0)
pH: 7 (ref 5.0–8.0)

## 2020-03-07 LAB — POC URINE PREG, ED: Preg Test, Ur: NEGATIVE

## 2020-03-07 MED ORDER — NITROFURANTOIN MONOHYD MACRO 100 MG PO CAPS: 100.0000 mg | ORAL_CAPSULE | Freq: Two times a day (BID) | ORAL | 0 refills | Status: AC

## 2020-03-07 NOTE — Discharge Instructions (Signed)
Your urine indicates potentially mild UTI.  I have started an antibiotic as we await a final culture to confirm this test.  Drink plenty of water to empty bladder regularly. Avoid alcohol and caffeine as these may irritate the bladder.   If symptoms worsen or do not improve in the next week to return to be seen or to follow up with your PCP.

## 2020-03-07 NOTE — ED Triage Notes (Signed)
Pt /o frequent urination, "feels weird when I pee, but doesn't hurt" started yesterday

## 2020-03-08 NOTE — ED Provider Notes (Signed)
[facility]    CSN: [id number] Arrival date & time: 03/07/20  1951      History   Chief Complaint Chief Complaint  Patient presents with  . Urinary Tract Infection    HPI Patricia Duran is a 24 y.o. female.   Patricia Duran presents with complaints of two days of urinary frequency. No burning with urination but feels "weird."  Has had a UTI in the past which was more painful. No increased thirst. No blood to urine. No vaginal symptoms. No pelvic or abdominal pain. No new back pain. She is not diabetic. No fevers. No nausea or vomiting. She does not have regular periods related to her depo injection, denies any current vaginal bleeding.    ROS per HPI, negative if not otherwise mentioned.      Past Medical History:  Diagnosis Date  . Asthma   . Obesity     Patient Active Problem List   Diagnosis Date Noted  . Dysmenorrhea 05/27/2016    Past Surgical History:  Procedure Laterality Date  . TOE SURGERY      OB History    Gravida  0   Para  0   Term  0   Preterm  0   AB  0   Living  0     SAB  0   TAB  0   Ectopic  0   Multiple  0   Live Births  0            Home Medications    Prior to Admission medications   Medication Sig Start Date End Date Taking? Authorizing Provider  albuterol (PROVENTIL HFA;VENTOLIN HFA) 108 (90 BASE) MCG/ACT inhaler Inhale 1 puff into the lungs every 6 (six) hours as needed for wheezing or shortness of breath.    [provider]  cetirizine (ZYRTEC) 10 MG tablet Take 10 mg by mouth daily. 09/14/19   [provider]  cyclobenzaprine (FLEXERIL) 5 MG tablet Take 5-10 mg by mouth at bedtime as needed. 09/14/19   [provider]  fluticasone (FLONASE) 50 MCG/ACT nasal spray Place 1-2 sprays into both nostrils daily for 7 days. 08/09/19 08/16/19  Wieters, Hallie C, PA-C  medroxyPROGESTERone (DEPO-PROVERA) 150 MG/ML injection INJECT IN THE MUSCLE EVERY 3 MONTHS 11/21/19    Brock Bad, MD  methocarbamol (ROBAXIN) 500 MG tablet Take 500 mg by mouth every 6 (six) hours as needed. 10/07/19   [provider]  metroNIDAZOLE (FLAGYL) 500 MG tablet Take 500 mg by mouth 2 (two) times daily. 09/14/19   [provider]  MOBIC 15 MG tablet Take 15 mg by mouth daily as needed. 09/14/19   [provider]  nitrofurantoin, macrocrystal-monohydrate, (MACROBID) 100 MG capsule Take 1 capsule (100 mg total) by mouth 2 (two) times daily for 5 days. 03/07/20 03/12/20  Georgetta Haber, NP  phenazopyridine (PYRIDIUM) 100 MG tablet Take 1 tablet (100 mg total) by mouth 3 (three) times daily as needed for pain. 01/02/20   Durward Parcel, FNP  tinidazole (TINDAMAX) 500 MG tablet Take 2 tablets (1,000 mg total) by mouth daily with breakfast. 09/03/19   Brock Bad, MD  Norethin Ace-Eth Estrad-FE (TAYTULLA) 1-20 MG-MCG(24) CAPS Take 1 capsule by mouth daily before breakfast. 07/11/19 08/09/19  Brock Bad, MD    Family History Family History  Problem Relation Age of Onset  . Asthma Other   . Hypertension Other   . Hyperlipidemia Other   .  Sleep apnea Other   . Stroke Other   . Heart attack Other   . Hypertension Mother   . Asthma Mother   . Hypertension Father     Social History Social History   Tobacco Use  . Smoking status: Current Some Day Smoker    Types: Cigars  . Smokeless tobacco: Never Used  . Tobacco comment: black and milds  Substance Use Topics  . Alcohol use: No  . Drug use: No     Allergies   Patient has no known allergies.   Review of Systems Review of Systems   Physical Exam Triage Vital Signs ED Triage Vitals  Enc Vitals Group     BP 03/07/20 2000 123/78     Pulse Rate 03/07/20 2000 87     Resp 03/07/20 2000 16     Temp 03/07/20 2000 98.6 F (37 C)     Temp Source 03/07/20 2000 Oral     SpO2 03/07/20 2000 99 %     Weight 03/07/20 2001 (!) 370 lb (167.8 kg)     Height 03/07/20 2001 5\' 9"  (1.753 m)       Head Circumference --      Peak Flow --      Pain Score 03/07/20 2001 0     Pain Loc --      Pain Edu? --      Excl. in GC? --    No data found.  Updated Vital Signs BP 123/78   Pulse 87   Temp 98.6 F (37 C) (Oral)   Resp 16   Ht 5\' 9"  (1.753 m)   Wt (!) 370 lb (167.8 kg)   SpO2 99%   BMI 54.64 kg/m   Visual Acuity Right Eye Distance:   Left Eye Distance:   Bilateral Distance:    Right Eye Near:   Left Eye Near:    Bilateral Near:     Physical Exam Constitutional:      General: She is not in acute distress.    Appearance: She is well-developed.  Cardiovascular:     Rate and Rhythm: Normal rate.  Pulmonary:     Effort: Pulmonary effort is normal.  Abdominal:     Tenderness: There is no abdominal tenderness. There is no right CVA tenderness or left CVA tenderness.  Skin:    General: Skin is warm and dry.  Neurological:     Mental Status: She is alert and oriented to person, place, and time.      UC Treatments / Results  Labs (all labs ordered are listed, but only abnormal results are displayed) Labs Reviewed  POCT URINALYSIS DIP (DEVICE) - Abnormal; Notable for the following components:      Result Value   Hgb urine dipstick TRACE (*)    Leukocytes,Ua TRACE (*)    All other components within normal limits  URINE CULTURE  POC URINE PREG, ED    EKG   Radiology No results found.  Procedures Procedures (including critical care time)  Medications Ordered in UC Medications - No data to display  Initial Impression / Assessment and Plan / UC Course  I have reviewed the triage vital signs and the nursing notes.  Pertinent labs & imaging results that were available during my care of the patient were reviewed by me and considered in my medical decision making (see chart for details).     New onset urinary frequency with trace hgb and leukocytes to urine dip. Culture obtained and pending with  macrobid initiated. No glucose to urine. Return  precautions provided. Patient verbalized understanding and agreeable to plan.   Final Clinical Impressions(s) / UC Diagnoses   Final diagnoses:  Urinary frequency     Discharge Instructions     Your urine indicates potentially mild UTI.  I have started an antibiotic as we await a final culture to confirm this test.  Drink plenty of water to empty bladder regularly. Avoid alcohol and caffeine as these may irritate the bladder.   If symptoms worsen or do not improve in the next week to return to be seen or to follow up with your PCP.     ED Prescriptions    Medication Sig Dispense Auth. Provider   nitrofurantoin, macrocrystal-monohydrate, (MACROBID) 100 MG capsule Take 1 capsule (100 mg total) by mouth 2 (two) times daily for 5 days. 10 capsule Georgetta Haber, NP     PDMP not reviewed this encounter.   Georgetta Haber, NP 03/08/20 2021

## 2020-03-09 LAB — URINE CULTURE

## 2020-05-09 ENCOUNTER — Ambulatory Visit: Payer: Medicaid Other

## 2020-05-15 ENCOUNTER — Encounter: Payer: Self-pay | Admitting: Obstetrics

## 2020-05-15 ENCOUNTER — Other Ambulatory Visit: Payer: Self-pay

## 2020-05-15 ENCOUNTER — Ambulatory Visit (INDEPENDENT_AMBULATORY_CARE_PROVIDER_SITE_OTHER): Payer: Medicaid Other

## 2020-05-15 DIAGNOSIS — Z3042 Encounter for surveillance of injectable contraceptive: Secondary | ICD-10-CM

## 2020-05-15 NOTE — Progress Notes (Signed)
Pt is in the office for depo injection, administered in LD, per pt request, and pt tolerated well. Next due Dec 9-23 .Marland Kitchen Administrations This Visit    medroxyPROGESTERone (DEPO-PROVERA) injection 150 mg    Admin Date 05/15/2020 Action Given Dose 150 mg Route Intramuscular Administered By Katrina Stack, RN

## 2020-05-15 NOTE — Progress Notes (Signed)
Patient was assessed and managed by nursing staff during this encounter. I have reviewed the chart and agree with the documentation and plan. I have also made any necessary editorial changes.  Dustine Stickler, MD 05/15/2020 10:47 AM    

## 2020-06-02 DIAGNOSIS — M545 Low back pain, unspecified: Secondary | ICD-10-CM | POA: Diagnosis not present

## 2020-07-03 ENCOUNTER — Telehealth: Payer: Self-pay

## 2020-07-03 NOTE — Telephone Encounter (Signed)
Patient complains of having an increase in blisters in her her groin on and off this year, but has has increased in the last couple of months. She states that she has been putting aloe vera on the areas when they erupt. She also complains of the area burning when she urinates. She denies exposure to HSV that she is aware of. Advised patient that she needs to be seen for evaluation. Patient transferred to front desk to schedule appt

## 2020-07-04 ENCOUNTER — Other Ambulatory Visit (HOSPITAL_COMMUNITY)
Admission: RE | Admit: 2020-07-04 | Discharge: 2020-07-04 | Disposition: A | Discharge status: Home / Self Care | Payer: Medicaid Other | Source: Ambulatory Visit | Attending: Obstetrics | Admitting: Obstetrics

## 2020-07-04 ENCOUNTER — Ambulatory Visit (INDEPENDENT_AMBULATORY_CARE_PROVIDER_SITE_OTHER): Payer: Medicaid Other | Admitting: Obstetrics

## 2020-07-04 ENCOUNTER — Other Ambulatory Visit: Payer: Self-pay

## 2020-07-04 ENCOUNTER — Encounter: Payer: Self-pay | Admitting: Obstetrics

## 2020-07-04 VITALS — BP 132/84 | HR 96 | Wt 380.0 lb

## 2020-07-04 DIAGNOSIS — R52 Pain, unspecified: Secondary | ICD-10-CM

## 2020-07-04 DIAGNOSIS — Z113 Encounter for screening for infections with a predominantly sexual mode of transmission: Secondary | ICD-10-CM

## 2020-07-04 DIAGNOSIS — N898 Other specified noninflammatory disorders of vagina: Secondary | ICD-10-CM | POA: Insufficient documentation

## 2020-07-04 DIAGNOSIS — N766 Ulceration of vulva: Secondary | ICD-10-CM

## 2020-07-04 DIAGNOSIS — A6004 Herpesviral vulvovaginitis: Secondary | ICD-10-CM | POA: Diagnosis not present

## 2020-07-04 DIAGNOSIS — R300 Dysuria: Secondary | ICD-10-CM | POA: Diagnosis not present

## 2020-07-04 LAB — POCT URINALYSIS DIP (MANUAL ENTRY)

## 2020-07-04 MED ORDER — VALACYCLOVIR HCL 1 G PO TABS: 1000.0000 mg | ORAL_TABLET | Freq: Two times a day (BID) | ORAL | 11 refills | Status: DC

## 2020-07-04 NOTE — Patient Instructions (Signed)
° ° ° °  If you have lab work done today you will be contacted with your lab results within the next 2 weeks.  If you have not heard from us then please contact us. The fastest way to get your results is to register for My Chart. ° ° °IF you received an x-ray today, you will receive an invoice from [facility]. Please contact [facility] at [phone] with questions or concerns regarding your invoice.  ° °IF you received labwork today, you will receive an invoice from [facility]. Please contact [facility] at [phone] with questions or concerns regarding your invoice.  ° °Our billing staff will not be able to assist you with questions regarding bills from these companies. ° °You will be contacted with the lab results as soon as they are available. The fastest way to get your results is to activate your My Chart account. Instructions are located on the last page of this paperwork. If you have not heard from us regarding the results in 2 weeks, please contact this office. °  ° ° ° °

## 2020-07-04 NOTE — Progress Notes (Signed)
Patient ID: Patricia Duran, female   DOB: 1996/07/13, 24 y.o.   MRN: [id number]  Chief Complaint  Patient presents with   Vaginal Pain    has bumps on vaginal area, burning when urination    HPI Patricia Duran is a 24 y.o. female.  Complains of  painful " bumps " in vaginal area externally.  The lesions first appeared last week and she thought that they were hair bumps that were caused by shaving. Very painful when urine passes over them. HPI  Past Medical History:  Diagnosis Date   Asthma    Obesity     Past Surgical History:  Procedure Laterality Date   TOE SURGERY      Family History  Problem Relation Age of Onset   Asthma Other    Hypertension Other    Hyperlipidemia Other    Sleep apnea Other    Stroke Other    Heart attack Other    Hypertension Mother    Asthma Mother    Hypertension Father     Social History Social History   Tobacco Use   Smoking status: Current Some Day Smoker    Types: Cigars   Smokeless tobacco: Never Used   Tobacco comment: black and milds  Substance Use Topics   Alcohol use: No   Drug use: No    No Known Allergies  Current Outpatient Medications  Medication Sig Dispense Refill   albuterol (PROVENTIL HFA;VENTOLIN HFA) 108 (90 BASE) MCG/ACT inhaler Inhale 1 puff into the lungs every 6 (six) hours as needed for wheezing or shortness of breath.      cetirizine (ZYRTEC) 10 MG tablet Take 10 mg by mouth daily.      cyclobenzaprine (FLEXERIL) 5 MG tablet Take 5-10 mg by mouth at bedtime as needed.     medroxyPROGESTERone (DEPO-PROVERA) 150 MG/ML injection INJECT IN THE MUSCLE EVERY 3 MONTHS 1 mL 4   methocarbamol (ROBAXIN) 500 MG tablet Take 500 mg by mouth every 6 (six) hours as needed.      MOBIC 15 MG tablet Take 15 mg by mouth daily as needed.     phenazopyridine (PYRIDIUM) 100 MG tablet Take 1 tablet (100 mg total) by mouth 3 (three) times daily as needed for pain. 10 tablet 0   tinidazole  (TINDAMAX) 500 MG tablet Take 2 tablets (1,000 mg total) by mouth daily with breakfast. 10 tablet 2   fluticasone (FLONASE) 50 MCG/ACT nasal spray Place 1-2 sprays into both nostrils daily for 7 days. 1 g 0   valACYclovir (VALTREX) 1000 MG tablet Take 1 tablet (1,000 mg total) by mouth 2 (two) times daily. 30 tablet 11   Current Facility-Administered Medications  Medication Dose Route Frequency Provider Last Rate Last Admin   medroxyPROGESTERone (DEPO-PROVERA) injection 150 mg  150 mg Intramuscular Q90 days Hermina Staggers, MD   150 mg at 05/15/20 0830    Review of Systems Review of Systems Constitutional: negative for fatigue and weight loss Respiratory: negative for cough and wheezing Cardiovascular: negative for chest pain, fatigue and palpitations Gastrointestinal: negative for abdominal pain and change in bowel habits Genitourinary: positive for painful vulva ulcerations Integument/breast: negative for nipple discharge Musculoskeletal:negative for myalgias Neurological: negative for gait problems and tremors Behavioral/Psych: negative for abusive relationship, depression Endocrine: negative for temperature intolerance      Blood pressure 132/84, pulse 96, weight (!) 380 lb (172.4 kg).  Physical Exam Physical Exam           General:  Alert and no distress Abdomen:  normal findings: no organomegaly, soft, non-tender and no hernia  Pelvis:  External genitalia: normal general appearance Urinary system: urethral meatus normal and bladder without fullness, nontender Vaginal: normal without tenderness, induration or masses Cervix: normal appearance Adnexa: normal bimanual exam Uterus: anteverted and non-tender, normal size    50% of 20 min visit spent on counseling and coordination of care.   Data Reviewed Wet prep Cultures  Assessment     1. Vulvar ulceration, painful - probable genital herpes  2. Herpes simplex vulvovaginitis Rx: - valACYclovir (VALTREX) 1000 MG  tablet; Take 1 tablet (1,000 mg total) by mouth 2 (two) times daily.  Dispense: 30 tablet; Refill: 11  3. Vaginal discharge Rx: - Cervicovaginal ancillary only  4. Screen for STD (sexually transmitted disease) Rx: - Herpes simplex virus culture - HIV Antibody (routine testing w rflx) - RPR  5. Dysuria Rx: - POCT urinalysis dipstick  6. Pain - has Mobic on hand.  Will take prn.    Plan  Follow up in 3 months for Annual / Pap   Orders Placed This Encounter  Procedures   Herpes simplex virus culture   HIV Antibody (routine testing w rflx)   RPR   POCT urinalysis dipstick   Meds ordered this encounter  Medications   valACYclovir (VALTREX) 1000 MG tablet    Sig: Take 1 tablet (1,000 mg total) by mouth 2 (two) times daily.    Dispense:  30 tablet    Refill:  11     Brock Bad, MD 07/04/2020 9:15 AM

## 2020-07-05 LAB — HIV ANTIBODY (ROUTINE TESTING W REFLEX): HIV Screen 4th Generation wRfx: NONREACTIVE

## 2020-07-05 LAB — RPR: RPR Ser Ql: NONREACTIVE

## 2020-07-07 ENCOUNTER — Other Ambulatory Visit: Payer: Self-pay | Admitting: Obstetrics

## 2020-07-07 ENCOUNTER — Telehealth: Payer: Self-pay

## 2020-07-07 DIAGNOSIS — N898 Other specified noninflammatory disorders of vagina: Secondary | ICD-10-CM

## 2020-07-07 LAB — CERVICOVAGINAL ANCILLARY ONLY
Bacterial Vaginitis (gardnerella): POSITIVE — AB
Candida Glabrata: NEGATIVE
Candida Vaginitis: NEGATIVE
Chlamydia: NEGATIVE
Comment: NEGATIVE
Comment: NEGATIVE
Comment: NEGATIVE
Comment: NEGATIVE
Comment: NEGATIVE
Comment: NORMAL
Neisseria Gonorrhea: NEGATIVE
Trichomonas: NEGATIVE

## 2020-07-07 LAB — HERPES SIMPLEX VIRUS CULTURE

## 2020-07-07 MED ORDER — TINIDAZOLE 500 MG PO TABS: 1000.0000 mg | ORAL_TABLET | Freq: Every day | ORAL | 2 refills | Status: DC

## 2020-07-07 NOTE — Telephone Encounter (Signed)
Call patient to inform her of test results. 

## 2020-07-07 NOTE — Telephone Encounter (Signed)
-----   Message from Brock Bad, MD sent at 07/07/2020  8:30 AM EST ----- HSV-2 positive culture.  Valtrex Rx.

## 2020-07-08 ENCOUNTER — Other Ambulatory Visit: Payer: Self-pay

## 2020-07-08 DIAGNOSIS — N898 Other specified noninflammatory disorders of vagina: Secondary | ICD-10-CM

## 2020-07-08 MED ORDER — METRONIDAZOLE 500 MG PO TABS: 500.0000 mg | ORAL_TABLET | Freq: Two times a day (BID) | ORAL | 0 refills | Status: DC

## 2020-07-08 NOTE — Progress Notes (Signed)
Rx tinidazole denied by [employer] Rx for Metronidazole sent to pt pharmacy today. Rx is covered by insurance.

## 2020-07-08 NOTE — Telephone Encounter (Signed)
-----   Message from Brock Bad, MD sent at 07/07/2020  2:45 PM EST ----- Tindamax Rx for BV

## 2020-08-07 ENCOUNTER — Ambulatory Visit: Payer: Medicaid Other

## 2020-08-12 ENCOUNTER — Ambulatory Visit (INDEPENDENT_AMBULATORY_CARE_PROVIDER_SITE_OTHER): Payer: Medicaid Other

## 2020-08-12 ENCOUNTER — Encounter: Payer: Self-pay | Admitting: Obstetrics

## 2020-08-12 ENCOUNTER — Other Ambulatory Visit: Payer: Self-pay

## 2020-08-12 DIAGNOSIS — Z3042 Encounter for surveillance of injectable contraceptive: Secondary | ICD-10-CM

## 2020-08-12 MED ORDER — MEDROXYPROGESTERONE ACETATE 150 MG/ML IM SUSP
150.0000 mg | Freq: Once | INTRAMUSCULAR | Status: AC
  Administered 2020-08-12: 150 mg via INTRAMUSCULAR

## 2020-08-12 NOTE — Progress Notes (Signed)
Patient presents for depo injection. Patient is within her window. Injection given in RD. Patient tolerated well. Next depo due 3/8-3/22.

## 2020-08-12 NOTE — Progress Notes (Signed)
Patient was assessed and managed by nursing staff during this encounter. I have reviewed the chart and agree with the documentation and plan. I have also made any necessary editorial changes.  Lavan Imes, MD 08/12/2020 4:16 PM 

## 2020-09-04 ENCOUNTER — Other Ambulatory Visit: Payer: Self-pay

## 2020-09-04 ENCOUNTER — Encounter (INDEPENDENT_AMBULATORY_CARE_PROVIDER_SITE_OTHER): Payer: Self-pay | Admitting: Family Medicine

## 2020-09-04 ENCOUNTER — Other Ambulatory Visit (HOSPITAL_COMMUNITY)
Admission: RE | Admit: 2020-09-04 | Discharge: 2020-09-04 | Disposition: A | Discharge status: Home / Self Care | Payer: Medicaid Other | Source: Ambulatory Visit | Attending: Obstetrics | Admitting: Obstetrics

## 2020-09-04 ENCOUNTER — Ambulatory Visit (INDEPENDENT_AMBULATORY_CARE_PROVIDER_SITE_OTHER): Payer: Medicaid Other | Admitting: Obstetrics

## 2020-09-04 ENCOUNTER — Encounter: Payer: Self-pay | Admitting: Obstetrics

## 2020-09-04 ENCOUNTER — Ambulatory Visit (INDEPENDENT_AMBULATORY_CARE_PROVIDER_SITE_OTHER): Payer: Medicaid Other | Admitting: Family Medicine

## 2020-09-04 VITALS — BP 134/86 | HR 87 | Ht 69.0 in | Wt 382.8 lb

## 2020-09-04 VITALS — BP 127/85 | HR 87 | Temp 98.5°F | Ht 69.0 in | Wt 376.0 lb

## 2020-09-04 DIAGNOSIS — M545 Low back pain, unspecified: Secondary | ICD-10-CM

## 2020-09-04 DIAGNOSIS — R0602 Shortness of breath: Secondary | ICD-10-CM | POA: Diagnosis not present

## 2020-09-04 DIAGNOSIS — B9689 Other specified bacterial agents as the cause of diseases classified elsewhere: Secondary | ICD-10-CM

## 2020-09-04 DIAGNOSIS — N760 Acute vaginitis: Secondary | ICD-10-CM

## 2020-09-04 DIAGNOSIS — E65 Localized adiposity: Secondary | ICD-10-CM

## 2020-09-04 DIAGNOSIS — R7301 Impaired fasting glucose: Secondary | ICD-10-CM | POA: Diagnosis not present

## 2020-09-04 DIAGNOSIS — N898 Other specified noninflammatory disorders of vagina: Secondary | ICD-10-CM

## 2020-09-04 DIAGNOSIS — Z3042 Encounter for surveillance of injectable contraceptive: Secondary | ICD-10-CM

## 2020-09-04 DIAGNOSIS — Z6841 Body Mass Index (BMI) 40.0 and over, adult: Secondary | ICD-10-CM

## 2020-09-04 DIAGNOSIS — Z01419 Encounter for gynecological examination (general) (routine) without abnormal findings: Secondary | ICD-10-CM | POA: Insufficient documentation

## 2020-09-04 DIAGNOSIS — K219 Gastro-esophageal reflux disease without esophagitis: Secondary | ICD-10-CM

## 2020-09-04 DIAGNOSIS — G8929 Other chronic pain: Secondary | ICD-10-CM | POA: Diagnosis not present

## 2020-09-04 DIAGNOSIS — E559 Vitamin D deficiency, unspecified: Secondary | ICD-10-CM | POA: Diagnosis not present

## 2020-09-04 DIAGNOSIS — Z789 Other specified health status: Secondary | ICD-10-CM

## 2020-09-04 DIAGNOSIS — Z113 Encounter for screening for infections with a predominantly sexual mode of transmission: Secondary | ICD-10-CM | POA: Diagnosis not present

## 2020-09-04 DIAGNOSIS — F3289 Other specified depressive episodes: Secondary | ICD-10-CM | POA: Diagnosis not present

## 2020-09-04 DIAGNOSIS — Z3009 Encounter for other general counseling and advice on contraception: Secondary | ICD-10-CM

## 2020-09-04 DIAGNOSIS — A6004 Herpesviral vulvovaginitis: Secondary | ICD-10-CM

## 2020-09-04 DIAGNOSIS — R0683 Snoring: Secondary | ICD-10-CM | POA: Diagnosis not present

## 2020-09-04 DIAGNOSIS — M791 Myalgia, unspecified site: Secondary | ICD-10-CM | POA: Diagnosis not present

## 2020-09-04 DIAGNOSIS — R5383 Other fatigue: Secondary | ICD-10-CM

## 2020-09-04 DIAGNOSIS — N946 Dysmenorrhea, unspecified: Secondary | ICD-10-CM | POA: Diagnosis not present

## 2020-09-04 DIAGNOSIS — R87612 Low grade squamous intraepithelial lesion on cytologic smear of cervix (LGSIL): Secondary | ICD-10-CM

## 2020-09-04 DIAGNOSIS — Z0289 Encounter for other administrative examinations: Secondary | ICD-10-CM

## 2020-09-04 DIAGNOSIS — F17200 Nicotine dependence, unspecified, uncomplicated: Secondary | ICD-10-CM

## 2020-09-04 DIAGNOSIS — E66813 Obesity, class 3: Secondary | ICD-10-CM

## 2020-09-04 MED ORDER — METRONIDAZOLE 500 MG PO TABS
500.0000 mg | ORAL_TABLET | Freq: Two times a day (BID) | ORAL | 2 refills | Status: DC
Start: 2020-09-04 — End: 2021-03-24

## 2020-09-04 NOTE — Progress Notes (Signed)
Patient presents for AEX. Patient complains of having vaginal discharge with a slight odor. Patient would like to have STD testing.  Last Pap: LGSIL

## 2020-09-04 NOTE — Patient Instructions (Signed)
Cervical Dysplasia  Cervical dysplasia is a condition in which the cells in a woman's cervix have abnormal changes. The cervix is the opening of the uterus. It is located between the vagina and the uterus. Cervical dysplasia may be an early sign of cervical cancer. If left untreated, this condition may become more severe and may progress to cervical cancer. Early detection, treatment, and follow-up care are very important. What are the causes? Cervical dysplasia is usually caused by a human papillomavirus (HPV) infection. HPV is spread from person to person through sexual contact. This includes oral, vaginal, or anal sex. HPV is the most common sexually transmitted infection (STI). You are more likely to be exposed to HPV through sexual contact if:  You have had more than one sexual partner or you have a sexual partner who has multiple sexual partners.  You do not use a condom during sex, especially with new sexual partners. What increases the risk? The following factors may make you more likely to develop this condition:  Having a family history of cervical cancer or a personal history of cancer of the vagina or vulva.  Having had an STI, such as herpes, chlamydia, or gonorrhea.  Becoming sexually active before age 18.  Having a weakened disease-fighting system (immunesystem).  Smoking.  Being the daughter of a woman who took diethylstilbestrol (DES), a synthetic estrogen, during pregnancy. What are the signs or symptoms? There are usually no symptoms of this condition. If you do have symptoms, they may include:  Abnormal vaginal discharge.  Bleeding between periods or after sex.  Bleeding during menopause.  Pain during sex. How is this diagnosed? This condition may be diagnosed with a Pap test. During this test, cells are swabbed from the cervix and checked under a microscope. If the Pap test is abnormal or if the cervix looks abnormal, you may also have a test in which a  tissue sample is removed from the cervix and looked at under a microscope(biopsy). How is this treated? Treatment varies based on the severity of the condition. Treatment may include:  Cryotherapy. During this therapy, the abnormal cells are frozen with a steel-tipped instrument.  Loop electrosurgical excision procedure (LEEP). LEEP removes abnormal tissue from the cervix.  Surgery to remove abnormal tissue. This is usually done in more severe cases. Options include: ? A cone biopsy. This treatment removes the cervical canal and part of the center of the cervix. ? Hysterectomy. This is a surgery in which the uterus and cervix are removed. Follow these instructions at home:  Take over-the-counter and prescription medicines only as told by your health care provider.  Do not use tampons, have sex, or douche until your health care provider says it is safe.  Keep all follow-up visits. This is important. Women who have been treated for cervical dysplasia should have regular pelvic exams and Pap tests. How is this prevented?  Practice safe sex to help prevent STIs.  Have regular Pap tests. Talk with your health care provider about how often you need these tests. Pap tests will help identify cell changes that can lead to cancer.  Ask your health care provider about possible vaccines to protect yourself against HPV. Contact a health care provider if:  You develop genital warts. The risk of cervical cancer is higher with certain types of HPV.  Your menstrual period is heavier than normal or you develop bright red bleeding, which may include blood clots.  You have abnormal vaginal discharge.  You have a fever.   Get help right away if:  You have pain or cramps in the abdomen that get worse, and medicine does not help to relieve your pain.  You feel light-headed and are unusually weak, or you faint. Summary  Cervical dysplasia is a condition in which a woman's cervix cells have abnormal  changes.  If left untreated, this condition may become more severe and may progress to cervical cancer.  Early detection, treatment, and follow-up care are very important in managing this condition.  Have regular pelvic exams and Pap tests. Talk with your health care provider about how often you need these tests. Pap tests will help identify cell changes that can lead to cancer. This information is not intended to replace advice given to you by your health care provider. Make sure you discuss any questions you have with your health care provider. Document Revised: 02/15/2020 Document Reviewed: 02/15/2020 Elsevier Patient Education  2021 Elsevier Inc.  [email or web address]~/[email or web address];_z=z">  Colposcopy  Colposcopy is a procedure to examine the lowest part of the uterus (cervix), the vagina, and the area around the vaginal opening (vulva) for abnormalities or signs of disease. This procedure is done using an instrument that makes objects appear larger and provides light. (colposcope). During the procedure, the health care provider may remove a tissue sample to be looked at later under a microscope (biopsy). A biopsy may be done if any unusual cells are found during the colposcopy. You may have a colposcopy if you have:  An abnormal Pap smear, also called a Pap test. This screening test is used to check for signs of cancer or infection of the vagina, cervix, and uterus.  An HPV (human papillomavirus) test and get a positive result for a type of HPV that puts you at high risk of cancer.  Certain conditions or symptoms, such as: ? A sore, or lesion, on your cervix. ? Genital warts on your vulva, vagina, or cervix. ? Pain during sex. ? Vaginal bleeding, especially after sex.  A growth on your cervix (cervical polyp) that needs to be removed. Let your health care provider know about:  Any allergies you have, including allergies to  medicines, latex, or iodine.  All medicines you are taking, including vitamins, herbs, eye drops, creams, and over-the-counter medicines.  Any problems you or family members have had with anesthetic medicines.  Any blood disorders you have.  Any surgeries you have had.  Any medical conditions you have, such as pelvic inflammatory disease (PID) or endometrial disorder.  The pattern of your menstrual cycles and the form of birth control (contraception) you use, if any.  Your medical history, including any history of fainting often or of cervical treatment.  Whether you are pregnant or may be pregnant. What are the risks? Generally, this is a safe procedure. However, problems may occur, including:  Infection. Symptoms of infection may include fever, bad-smelling vaginal discharge, or pelvic pain.  Allergic reactions to medicines.  Damage to nearby structures or organs.  Fainting. This is rare. What happens before the procedure? Eating and drinking restrictions  Follow instructions from your health care provider about eating or drinking restrictions.  You will likely need to eat a regular diet the day of the procedure and not skip any meals. Tests  You may have an exam or testing. A pregnancy test will be done the day of the procedure.  You may have a blood or urine sample taken. General instructions  Ask your health care provider about: ? Changing or stopping your regular   medicines. This is especially important if you are taking diabetes medicines or blood thinners. ? Taking medicines such as aspirin and ibuprofen. These medicines can thin your blood. Do not take these medicines unless your health care provider tells you to take them. Your health care provider will likely tell you to avoid taking aspirin, or medicine that contains aspirin, for 7 days before the procedure. ? Taking over-the-counter medicines, vitamins, herbs, and supplements.  Tell your health care provider  if you have your menstrual period now or will have it at the time of your procedure. A colposcopy is not normally done during your menstrual period.  If you use contraception, continue to use it before your procedure.  For 24 hours before the procedure: ? Do not use douche products or tampons. ? Do not use medicines, creams, or suppositories in the vagina. ? Do not have sex.  Ask your health care provider what steps will be taken to prevent infection. What happens during the procedure?  You will lie down on your back, with your feet in foot rests (stirrups).  A tool called a speculum will be warmed and will have oil or gel put on it (will be lubricated). The speculum will then be inserted into your vagina. This will be used to hold apart the walls of your vagina so your health care provider can see your cervix and the inside of your vagina.  A cotton swab will be used to place a small amount of a liquid (solution) on the areas to be examined. This solution makes it easier to see abnormal cells. You may feel a slight burning during this part.  The colposcope will be used to scan the cervix with a bright white light. The colposcope will be held near your vulva and will make your vulva, vagina, and cervix look bigger so they can be seen better.  If a biopsy is needed: ? You may be given a medicine to numb the area (local anesthetic). ? Surgical tools will be used to remove mucus and cells through your vagina. ? You may feel mild pain while the tissue sample is removed. ? Bleeding may occur. A solution may be used to stop the bleeding. ? If a biopsy is needed from the inside of the cervix, a different procedure called endocervical curettage (ECC) may be done. During this procedure, a curved tool called a curette will be used to scrape cells from your cervix or the top of your cervix (endocervix).  Any abnormalities that are found will be recorded. The procedure may vary among health care  providers and hospitals. What happens after the procedure?  You will lie down and rest for a few minutes. You may be offered juice or cookies.  Your blood pressure, heart rate, breathing rate, and blood oxygen level will be monitored until you leave the hospital or clinic.  You may have some cramping in your abdomen. This should go away after a few minutes.  It is up to you to get the results of your procedure. Ask your health care provider, or the department that is doing the procedure, when your results will be ready. Summary  Colposcopy is a procedure to examine the lowest part of the uterus (cervix), the vagina, and the area around the vaginal opening (vulva) for abnormalities or signs of disease.  A biopsy may be done as part of the procedure.  After the procedure, you will remain lying down and will rest for a few minutes.  You   may have some cramping in your abdomen. This should go away after a few minutes. This information is not intended to replace advice given to you by your health care provider. Make sure you discuss any questions you have with your health care provider. Document Revised: 08/08/2019 Document Reviewed: 08/08/2019 Elsevier Patient Education  2021 Elsevier Inc.  

## 2020-09-04 NOTE — Progress Notes (Signed)
Subjective:        Patricia Duran is a 25 y.o. female here for a routine exam.  Current complaints: Malodorous vaginal discharge.    Personal health questionnaire:  Is patient Ashkenazi Jewish, have a family history of breast and/or ovarian cancer: no Is there a family history of uterine cancer diagnosed at age < 73, gastrointestinal cancer, urinary tract cancer, family member who is a Personnel officer syndrome-associated carrier: no Is the patient overweight and hypertensive, family history of diabetes, personal history of gestational diabetes, preeclampsia or PCOS: yes Is patient over 33, have PCOS,  family history of premature CHD under age 61, diabetes, smoke, have hypertension or peripheral artery disease:  no At any time, has a partner hit, kicked or otherwise hurt or frightened you?: no Over the past 2 weeks, have you felt down, depressed or hopeless?: no Over the past 2 weeks, have you felt little interest or pleasure in doing things?:no   Gynecologic History No LMP recorded. Patient has had an injection. Contraception: Depo-Provera injections Last Pap: 09-03-2019. Results were: LGSIL   Obstetric History OB History  Gravida Para Term Preterm AB Living  0 0 0 0 0 0  SAB IAB Ectopic Multiple Live Births  0 0 0 0 0    Past Medical History:  Diagnosis Date  . Asthma   . Back pain   . GERD (gastroesophageal reflux disease)   . HSV-2 (herpes simplex virus 2) infection   . Muscle spasm   . Obesity   . Thyroid disease     Past Surgical History:  Procedure Laterality Date  . TOE SURGERY    . WISDOM TOOTH EXTRACTION       Current Outpatient Medications:  .  medroxyPROGESTERone (DEPO-PROVERA) 150 MG/ML injection, INJECT IN THE MUSCLE EVERY 3 MONTHS, Disp: 1 mL, Rfl: 4 .  metroNIDAZOLE (FLAGYL) 500 MG tablet, Take 1 tablet (500 mg total) by mouth 2 (two) times daily., Disp: 14 tablet, Rfl: 2 .  valACYclovir (VALTREX) 1000 MG tablet, Take 1 tablet (1,000 mg total) by  mouth 2 (two) times daily., Disp: 30 tablet, Rfl: 11  Current Facility-Administered Medications:  .  medroxyPROGESTERone (DEPO-PROVERA) injection 150 mg, 150 mg, Intramuscular, Q90 days, Hermina Staggers, MD, 150 mg at 05/15/20 0830 No Known Allergies  Social History   Tobacco Use  . Smoking status: Current Every Day Smoker    Types: Cigars  . Smokeless tobacco: Never Used  . Tobacco comment: black and milds  Substance Use Topics  . Alcohol use: Yes    Comment: occ    Family History  Problem Relation Age of Onset  . Asthma Other   . Hypertension Other   . Hyperlipidemia Other   . Sleep apnea Other   . Stroke Other   . Heart attack Other   . Hypertension Mother   . Asthma Mother   . Sleep apnea Mother   . Obesity Mother   . Hypertension Father       Review of Systems  Constitutional: negative for fatigue and weight loss Respiratory: negative for cough and wheezing Cardiovascular: negative for chest pain, fatigue and palpitations Gastrointestinal: negative for abdominal pain and change in bowel habits Musculoskeletal:negative for myalgias Neurological: negative for gait problems and tremors Behavioral/Psych: negative for abusive relationship, depression Endocrine: negative for temperature intolerance    Genitourinary:negative for abnormal menstrual periods, genital lesions, hot flashes, sexual problems.  Positive for malodorous vaginal discharge Integument/breast: negative for breast lump, breast tenderness, nipple discharge  and skin lesion(s)    Objective:       Ht 5\' 9"  (1.753 m)   Wt (!) 382 lb 12.8 oz (173.6 kg)   BMI 56.53 kg/m  General:   alert and no distress  Skin:   no rash or abnormalities  Lungs:   clear to auscultation bilaterally  Heart:   regular rate and rhythm, S1, S2 normal, no murmur, click, rub or gallop  Breasts:   normal without suspicious masses, skin or nipple changes or axillary nodes  Abdomen:  normal findings: no organomegaly, soft,  non-tender and no hernia  Pelvis:  External genitalia: normal general appearance Urinary system: urethral meatus normal and bladder without fullness, nontender Vaginal: normal without tenderness, induration or masses Cervix: normal appearance Adnexa: normal bimanual exam Uterus: anteverted and non-tender, normal size   Lab Review Urine pregnancy test Labs reviewed yes Radiologic studies reviewed no  50% of 20 min visit spent on counseling and coordination of care.   Assessment:     1. Encounter for gynecological examination with Papanicolaou smear of cervix Rx: - Cytology - PAP( [place])  2. LGSIL on Pap smear of cervix - will schedule colposcopy  3. Vaginal discharge Rx: - Cervicovaginal ancillary only( [place])  4. Screening for STD (sexually transmitted disease) Rx: - Hepatitis B surface antigen - Hepatitis C antibody - HIV Antibody (routine testing w rflx) - RPR  5. BV (bacterial vaginosis) Rx: - metroNIDAZOLE (FLAGYL) 500 MG tablet; Take 1 tablet (500 mg total) by mouth 2 (two) times daily.  Dispense: 14 tablet; Refill: 2  6. Class 3 severe obesity due to excess calories without serious comorbidity with body mass index (BMI) of 50.0 to 59.9 in adult [facility]) - attending weight management classes at the [facility]  7. Tobacco dependence - cessation recomended  8. Surveillance for Depo-Provera contraception - wants to continue Depo.  Has had no weight gain with Depo  9. Encounter for counseling regarding contraception - continue Depo  10. Herpes simplex vulvovaginitis - taking daily Valtrex for suppression    Plan:    Education reviewed: calcium supplements, depression evaluation, low fat, low cholesterol diet, safe sex/STD prevention, self breast exams, smoking cessation and weight bearing exercise. Contraception: Depo-Provera injections. Follow up in: 1 year.   Meds ordered this encounter  Medications  .  metroNIDAZOLE (FLAGYL) 500 MG tablet    Sig: Take 1 tablet (500 mg total) by mouth 2 (two) times daily.    Dispense:  14 tablet    Refill:  2   Orders Placed This Encounter  Procedures  . Hepatitis B surface antigen  . Hepatitis C antibody  . HIV Antibody (routine testing w rflx)  . RPR    [facility], MD 09/04/2020 1:44 PM

## 2020-09-05 LAB — CBC WITH DIFFERENTIAL/PLATELET
Basophils Absolute: 0 10*3/uL (ref 0.0–0.2)
Basos: 1 %
EOS (ABSOLUTE): 0 10*3/uL (ref 0.0–0.4)
Eos: 1 %
Hemoglobin: 14.7 g/dL (ref 11.1–15.9)
Immature Grans (Abs): 0 10*3/uL (ref 0.0–0.1)
Immature Granulocytes: 0 %
Lymphocytes Absolute: 2.5 10*3/uL (ref 0.7–3.1)
Lymphs: 52 %
MCH: 28.6 pg (ref 26.6–33.0)
MCHC: 33.4 g/dL (ref 31.5–35.7)
MCV: 86 fL (ref 79–97)
Monocytes Absolute: 0.3 10*3/uL (ref 0.1–0.9)
Monocytes: 7 %
Neutrophils Absolute: 1.8 10*3/uL (ref 1.4–7.0)
Neutrophils: 39 %
Platelets: 264 10*3/uL (ref 150–450)
RBC: 5.14 x10E6/uL (ref 3.77–5.28)
RDW: 13.3 % (ref 11.7–15.4)
WBC: 4.7 10*3/uL (ref 3.4–10.8)

## 2020-09-05 LAB — COMPREHENSIVE METABOLIC PANEL
ALT: 15 IU/L (ref 0–32)
AST: 20 IU/L (ref 0–40)
Albumin/Globulin Ratio: 1.5 (ref 1.2–2.2)
Albumin: 4.6 g/dL (ref 3.9–5.0)
Alkaline Phosphatase: 68 IU/L (ref 44–121)
BUN/Creatinine Ratio: 14 (ref 9–23)
BUN: 11 mg/dL (ref 6–20)
Bilirubin Total: 0.5 mg/dL (ref 0.0–1.2)
CO2: 20 mmol/L (ref 20–29)
Calcium: 9.7 mg/dL (ref 8.7–10.2)
Chloride: 104 mmol/L (ref 96–106)
Creatinine, Ser: 0.8 mg/dL (ref 0.57–1.00)
GFR calc Af Amer: 121 mL/min/{1.73_m2} (ref >59)
GFR calc non Af Amer: 105 mL/min/{1.73_m2} (ref >59)
Globulin, Total: 3 g/dL (ref 1.5–4.5)
Glucose: 81 mg/dL (ref 65–99)
Potassium: 4.1 mmol/L (ref 3.5–5.2)
Sodium: 139 mmol/L (ref 134–144)
Total Protein: 7.6 g/dL (ref 6.0–8.5)

## 2020-09-05 LAB — ANEMIA PANEL
Ferritin: 124 ng/mL (ref 15–150)
Folate, Hemolysate: 331 ng/mL
Folate, RBC: 752 ng/mL (ref >498)
Hematocrit: 44 % (ref 34.0–46.6)
Iron Saturation: 30 % (ref 15–55)
Iron: 103 ug/dL (ref 27–159)
Retic Ct Pct: 1.4 % (ref 0.6–2.6)
Total Iron Binding Capacity: 343 ug/dL (ref 250–450)
UIBC: 240 ug/dL (ref 131–425)
Vitamin B-12: 435 pg/mL (ref 232–1245)

## 2020-09-05 LAB — CERVICOVAGINAL ANCILLARY ONLY
Bacterial Vaginitis (gardnerella): NEGATIVE
Candida Glabrata: NEGATIVE
Candida Vaginitis: NEGATIVE
Chlamydia: NEGATIVE
Comment: NEGATIVE
Comment: NEGATIVE
Comment: NEGATIVE
Comment: NEGATIVE
Comment: NEGATIVE
Comment: NORMAL
Neisseria Gonorrhea: NEGATIVE
Trichomonas: NEGATIVE

## 2020-09-05 LAB — HEMOGLOBIN A1C
Est. average glucose Bld gHb Est-mCnc: 91 mg/dL
Hgb A1c MFr Bld: 4.8 % (ref 4.8–5.6)

## 2020-09-05 LAB — LIPID PANEL
Chol/HDL Ratio: 3.1 ratio (ref 0.0–4.4)
Cholesterol, Total: 145 mg/dL (ref 100–199)
HDL: 47 mg/dL (ref >39)
LDL Chol Calc (NIH): 84 mg/dL (ref 0–99)
Triglycerides: 71 mg/dL (ref 0–149)
VLDL Cholesterol Cal: 14 mg/dL (ref 5–40)

## 2020-09-05 LAB — RPR: RPR Ser Ql: NONREACTIVE

## 2020-09-05 LAB — HIV ANTIBODY (ROUTINE TESTING W REFLEX): HIV Screen 4th Generation wRfx: NONREACTIVE

## 2020-09-05 LAB — INSULIN, RANDOM: INSULIN: 11.8 u[IU]/mL (ref 2.6–24.9)

## 2020-09-05 LAB — HEPATITIS B SURFACE ANTIGEN: Hepatitis B Surface Ag: NEGATIVE

## 2020-09-05 LAB — TSH: TSH: 2.44 u[IU]/mL (ref 0.450–4.500)

## 2020-09-05 LAB — VITAMIN D 25 HYDROXY (VIT D DEFICIENCY, FRACTURES): Vit D, 25-Hydroxy: 12.2 ng/mL — ABNORMAL LOW (ref 30.0–100.0)

## 2020-09-05 LAB — HEPATITIS C ANTIBODY: Hep C Virus Ab: <0.1 s/co ratio (ref 0.0–0.9)

## 2020-09-05 LAB — T4, FREE: Free T4: 1.52 ng/dL (ref 0.82–1.77)

## 2020-09-09 LAB — CYTOLOGY - PAP

## 2020-09-09 NOTE — Progress Notes (Signed)
Dear Patricia Fear, PA-C,   Thank you for referring Patricia Duran to our clinic. The following note includes my evaluation and treatment recommendations.  Chief Complaint:   OBESITY Patricia Duran (MR# [id number]) is a 25 y.o. female who presents for evaluation and treatment of obesity and related comorbidities. Current BMI is Body mass index is 55.53 kg/m. Patricia Duran has been struggling with her weight for many years and has been unsuccessful in either losing weight, maintaining weight loss, or reaching her healthy weight goal.  Patricia Duran is currently in the action stage of change and ready to dedicate time achieving and maintaining a healthier weight. Patricia Duran is interested in becoming our patient and working on intensive lifestyle modifications including (but not limited to) diet and exercise for weight loss.  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  She lives with her friend, Patricia Duran, who is 25.  She is a smoker.  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  She was referred here by [facility].  She has a history of muscle spasms, OA.  History of PT x2.  She has Mobic.  She also says she has history of "almost abnormal thyroid".  Patricia Duran provided the following food recall today: Breakfast:  [place] or eggs, toast. Lunch:  Skips often. Dinner:  Chicken, salmon.  Does not snack much. Works a 12-hour shift.  Eats some TV dinners.  Patricia Duran's habits were reviewed today and are as follows: Her family eats meals together, her desired weight loss is 120 pounds, she has been heavy most of her life, she started gaining weight in high school, her heaviest weight ever was 370 pounds, she is a picky eater and doesn't like to eat healthier foods, she craves tacos, shrimp, and chicken, she skips lunch frequently, she is frequently drinking liquids with calories, she frequently makes poor food choices, she frequently eats larger portions than normal and she  struggles with emotional eating.  Depression Screen Patricia Duran's Food and Mood (modified PHQ-9) score was 5.  Depression screen PHQ 2/9 09/04/2020  Decreased Interest 1  Down, Depressed, Hopeless 0  PHQ - 2 Score 1  Altered sleeping 0  Tired, decreased energy 1  Change in appetite 1  Feeling bad or failure about yourself  0  Trouble concentrating 2  Moving slowly or fidgety/restless 0  Suicidal thoughts 0  PHQ-9 Score 5  Difficult doing work/chores Somewhat difficult   Assessment/Plan:   1. Other fatigue Leylah denies daytime somnolence and denies waking up still tired. Patent has a history of symptoms of snoring. Leafy generally gets 8 hours of sleep per night, and states that she has generally restful sleep. Snoring is present. Apneic episodes are not present. Epworth Sleepiness Score is 5.  Patricia Duran does not feel that her weight is causing her energy to be lower than it should be. Fatigue may be related to obesity, depression or many other causes. Labs will be ordered, and in the meanwhile, Alvie will focus on self care including making healthy food choices, increasing physical activity and focusing on stress reduction.  - EKG 12-Lead - CBC with Differential/Platelet - Anemia panel - Comprehensive metabolic panel - Hemoglobin A1c - Insulin, random - Lipid panel - VITAMIN D 25 Hydroxy (Vit-D Deficiency, Fractures) - TSH - T4, free  2. SOB (shortness of breath) on exertion Patricia Duran notes increasing shortness of breath with exercising and seems to be worsening over time with weight gain. She notes getting out of  breath sooner with activity than she used to. This has gotten worse recently. Patricia Duran denies shortness of breath at rest or orthopnea.  Patricia Duran does feel that she gets out of breath more easily that she used to when she exercises. Patricia Duran's shortness of breath appears to be obesity related and exercise induced. She has agreed to work on weight loss and gradually  increase exercise to treat her exercise induced shortness of breath. Will continue to monitor closely.  - CBC with Differential/Platelet - Anemia panel - Comprehensive metabolic panel - Hemoglobin A1c - Insulin, random - Lipid panel - VITAMIN D 25 Hydroxy (Vit-D Deficiency, Fractures) - TSH - T4, free  3. Visceral obesity Current visceral fat rating: 19. Visceral fat rating should be < 13. Visceral adipose tissue is a hormonally active component of total body fat. This body composition phenotype is associated with medical disorders such as metabolic syndrome, cardiovascular disease and several malignancies including prostate, breast, and colorectal cancers. Starting goal: Lose 7-10% of starting weight.   4. Uses Depo-Provera as primary birth control method Audi is followed by her OBGYN, Dr. Clearance Coots. We reviewed the obesogenic nature of Depo Provera. She will discuss this with her GYN to decide if she will continue.  5. Gastroesophageal reflux disease, unspecified whether esophagitis present We reviewed the diagnosis of GERD and the reasons why it was important to treat. We discussed "red flag" symptoms and the importance of follow up if symptoms persisted despite treatment. We reviewed non-pharmacologic management of GERD symptoms: including: caffeine reduction, dietary changes, elevate HOB, NPO after supper, reduction of alcohol intake, tobacco cessation, and weight loss.  6. Snores Patricia Duran sleeps 8 hours at night.  Epworth score is 5. We will continue to monitor symptoms as they relate to her weight loss journey.  7. Myalgia Patricia Duran has history of muscle spasms.  She has been to PT in the past. We will continue to monitor symptoms as they relate to her weight loss journey.  8. Chronic low back pain, unspecified back pain laterality, unspecified whether sciatica present Patricia Duran has Mobic she can take as needed for pain.  Followed by [employer].  9. Elevated fasting  glucose Will check labs today, as per below.  - Comprehensive metabolic panel - Hemoglobin A1c - Insulin, random  10. Other depression, with emotional eating Angele eats when stressed and overeats.  PHQ-9 is 5.  11. Class 3 severe obesity with serious comorbidity and body mass index (BMI) of 50.0 to 59.9 in adult, unspecified obesity type (HCC)  Patricia Duran is currently in the action stage of change and her goal is to continue with weight loss efforts. I recommend Patricia Duran begin the structured treatment plan as follows:  She has agreed to the Category 4 Plan.  Exercise goals: No exercise has been prescribed at this time.   Behavioral modification strategies: increasing lean protein intake, decreasing simple carbohydrates, increasing vegetables, increasing water intake, decreasing liquid calories, decreasing alcohol intake, decreasing sodium intake and increasing high fiber foods.  She was informed of the importance of frequent follow-up visits to maximize her success with intensive lifestyle modifications for her multiple health conditions. She was informed we would discuss her lab results at her next visit unless there is a critical issue that needs to be addressed sooner. Patricia Duran agreed to keep her next visit at the agreed upon time to discuss these results.  Objective:   Blood pressure 127/85, pulse 87, temperature 98.5 F (36.9 C), temperature source Oral, height 5\' 9"  (1.753 m), weight )  376 lb (170.6 kg), SpO2 98 %. Body mass index is 55.53 kg/m.  EKG: Normal sinus rhythm, rate 84 bpm.  Indirect Calorimeter completed today shows a VO2 of 363 and a REE of 2530.  Her calculated basal metabolic rate is 9323 thus her basal metabolic rate is worse than expected.  General: Cooperative, alert, well developed, in no acute distress. HEENT: Conjunctivae and lids unremarkable. Cardiovascular: Regular rhythm.  Lungs: Normal work of breathing. Neurologic: No focal deficits.   Lab Results   Component Value Date   CREATININE 0.80 09/04/2020   BUN 11 09/04/2020   NA 139 09/04/2020   K 4.1 09/04/2020   CL 104 09/04/2020   CO2 20 09/04/2020   Lab Results  Component Value Date   ALT 15 09/04/2020   AST 20 09/04/2020   ALKPHOS 68 09/04/2020   BILITOT 0.5 09/04/2020   Lab Results  Component Value Date   HGBA1C 4.8 09/04/2020   Lab Results  Component Value Date   INSULIN 11.8 09/04/2020   Lab Results  Component Value Date   TSH 2.440 09/04/2020   Lab Results  Component Value Date   CHOL 145 09/04/2020   HDL 47 09/04/2020   LDLCALC 84 09/04/2020   TRIG 71 09/04/2020   CHOLHDL 3.1 09/04/2020   Lab Results  Component Value Date   WBC 4.7 09/04/2020   HGB 14.7 09/04/2020   HCT 44.0 09/04/2020   MCV 86 09/04/2020   PLT 264 09/04/2020   Lab Results  Component Value Date   IRON 103 09/04/2020   TIBC 343 09/04/2020   FERRITIN 124 09/04/2020   Attestation Statements:   Reviewed by clinician on day of visit: allergies, medications, problem list, medical history, surgical history, family history, social history, and previous encounter notes.  This is the patient's first visit at [facility]. The patient's NEW PATIENT PACKET was reviewed at length. Included in the packet: current and past health history, medications, allergies, ROS, gynecologic history (women only), surgical history, family history, social history, weight history, weight loss surgery history (for those that have had weight loss surgery), nutritional evaluation, mood and food questionnaire, PHQ9, Epworth questionnaire, sleep habits questionnaire, patient life and health improvement goals questionnaire. These will all be scanned into the patient's chart under media.   During the visit, I independently reviewed the patient's EKG, bioimpedance scale results, and indirect calorimeter results. I used this information to tailor a meal plan for the patient that will help her to lose weight  and will improve her obesity-related conditions going forward. I performed a medically necessary appropriate examination and/or evaluation. I discussed the assessment and treatment plan with the patient. The patient was provided an opportunity to ask questions and all were answered. The patient agreed with the plan and demonstrated an understanding of the instructions. Labs were ordered at this visit and will be reviewed at the next visit unless more critical results need to be addressed immediately. Clinical information was updated and documented in the EMR.   Time spent on visit including pre-visit chart review and post-visit charting and care was 60 minutes.   I, Insurance claims handler, CMA, am acting as transcriptionist for Helane Rima, DO  I have reviewed the above documentation for accuracy and completeness, and I agree with the above. Helane Rima, DO

## 2020-09-18 ENCOUNTER — Other Ambulatory Visit: Payer: Self-pay

## 2020-09-18 ENCOUNTER — Encounter (INDEPENDENT_AMBULATORY_CARE_PROVIDER_SITE_OTHER): Payer: Self-pay | Admitting: Family Medicine

## 2020-09-18 ENCOUNTER — Ambulatory Visit (INDEPENDENT_AMBULATORY_CARE_PROVIDER_SITE_OTHER): Payer: Medicaid Other | Admitting: Family Medicine

## 2020-09-18 VITALS — BP 119/79 | HR 88 | Temp 98.0°F | Ht 69.0 in | Wt 378.0 lb

## 2020-09-18 DIAGNOSIS — Z6841 Body Mass Index (BMI) 40.0 and over, adult: Secondary | ICD-10-CM

## 2020-09-18 DIAGNOSIS — M2550 Pain in unspecified joint: Secondary | ICD-10-CM | POA: Diagnosis not present

## 2020-09-18 DIAGNOSIS — F3289 Other specified depressive episodes: Secondary | ICD-10-CM | POA: Diagnosis not present

## 2020-09-18 DIAGNOSIS — E559 Vitamin D deficiency, unspecified: Secondary | ICD-10-CM | POA: Diagnosis not present

## 2020-09-18 MED ORDER — BUPROPION HCL ER (XL) 150 MG PO TB24
150.0000 mg | ORAL_TABLET | Freq: Every day | ORAL | 0 refills | Status: DC
Start: 2020-09-18 — End: 2020-10-15

## 2020-09-18 MED ORDER — VITAMIN D (ERGOCALCIFEROL) 1.25 MG (50000 UNIT) PO CAPS: 50000.0000 [IU] | ORAL_CAPSULE | ORAL | 0 refills | Status: DC

## 2020-09-22 NOTE — Progress Notes (Signed)
Chief Complaint:   OBESITY Patricia Duran is here to discuss her progress with her obesity treatment plan along with follow-up of her obesity related diagnoses.   Today's visit was #: 2 Starting weight: 376 lbs Starting date: 09/04/2020 Today's weight: 378 lbs Today's date: 09/18/2020 Total lbs lost to date: +2 lbs Body mass index is 55.82 kg/m.   Interim History: Patricia Duran says she tries eating meat with every meal.  She had Lean Cuisine, Healthy Choice.  She increased her water intake. Nutrition Plan: the Category 4 Plan for 75-80% of the time.  Activity: None at this time  Assessment/Plan:   1. Vitamin D deficiency Not at goal. Current vitamin D is 12.2, tested on 09/04/2020. Optimal goal > 50 ng/dL.   Plan:  [x]   Start Vitamin D @50 ,000 IU every week. []   Continue home supplement daily. [x]   Follow-up for routine testing of Vitamin D at least 2-3 times per year to avoid over-replacement.  - Start Vitamin D, Ergocalciferol, (DRISDOL) 1.25 MG (50000 UNIT) CAPS capsule; Take 1 capsule (50,000 Units total) by mouth every 7 (seven) days.  Dispense: 4 capsule; Refill: 0  2. Arthralgia Patricia Duran takes meloxicam as needed for joint pain. This issue directly impacts care plan for optimization of BMI and metabolic health as it impacts the patient's ability to make lifestyle changes. We will continue to monitor symptoms as they relate to her weight loss journey.  3. Other depression, with emotional eating Patricia Duran will start bupropion 150 mg daily.  Behavior modification techniques were discussed today to help Patricia Duran deal with her emotional/non-hunger eating behaviors.  Orders and follow up as documented in patient record.   - Start buPROPion (WELLBUTRIN XL) 150 MG 24 hr tablet; Take 1 tablet (150 mg total) by mouth daily.  Dispense: 30 tablet; Refill: 0  4. Class 3 severe obesity with serious comorbidity and body mass index (BMI) of 50.0 to 59.9 in adult, unspecified obesity type  (HCC)  Course: Patricia Duran is currently in the action stage of change. As such, her goal is to continue with weight loss efforts.   Nutrition goals: She has agreed to the Category 4 Plan with breakfast 350-450 calories/30+ grams of protein.   Exercise goals: No exercise has been prescribed at this time.  Behavioral modification strategies: increasing lean protein intake, decreasing simple carbohydrates, increasing vegetables and increasing water intake.  Patricia Duran has agreed to follow-up with our clinic in 2 weeks. She was informed of the importance of frequent follow-up visits to maximize her success with intensive lifestyle modifications for her multiple health conditions.   Objective:   Blood pressure 119/79, pulse 88, temperature 98 F (36.7 C), temperature source Oral, height 5\' 9"  (1.753 m), weight (!) 378 lb (171.5 kg), SpO2 98 %. Body mass index is 55.82 kg/m.  General: Cooperative, alert, well developed, in no acute distress. HEENT: Conjunctivae and lids unremarkable. Cardiovascular: Regular rhythm.  Lungs: Normal work of breathing. Neurologic: No focal deficits.   Lab Results  Component Value Date   CREATININE 0.80 09/04/2020   BUN 11 09/04/2020   NA 139 09/04/2020   K 4.1 09/04/2020   CL 104 09/04/2020   CO2 20 09/04/2020   Lab Results  Component Value Date   ALT 15 09/04/2020   AST 20 09/04/2020   ALKPHOS 68 09/04/2020   BILITOT 0.5 09/04/2020   Lab Results  Component Value Date   HGBA1C 4.8 09/04/2020   Lab Results  Component Value Date   INSULIN 11.8 09/04/2020  Lab Results  Component Value Date   TSH 2.440 09/04/2020   Lab Results  Component Value Date   CHOL 145 09/04/2020   HDL 47 09/04/2020   LDLCALC 84 09/04/2020   TRIG 71 09/04/2020   CHOLHDL 3.1 09/04/2020   Lab Results  Component Value Date   WBC 4.7 09/04/2020   HGB 14.7 09/04/2020   HCT 44.0 09/04/2020   MCV 86 09/04/2020   PLT 264 09/04/2020   Lab Results  Component Value Date    IRON 103 09/04/2020   TIBC 343 09/04/2020   FERRITIN 124 09/04/2020   Attestation Statements:   Reviewed by clinician on day of visit: allergies, medications, problem list, medical history, surgical history, family history, social history, and previous encounter notes.  I, Insurance claims handler, CMA, am acting as transcriptionist for Helane Rima, DO  I have reviewed the above documentation for accuracy and completeness, and I agree with the above. Helane Rima, DO

## 2020-10-01 ENCOUNTER — Ambulatory Visit (INDEPENDENT_AMBULATORY_CARE_PROVIDER_SITE_OTHER): Payer: Medicaid Other | Admitting: Adult Health

## 2020-10-02 ENCOUNTER — Encounter (INDEPENDENT_AMBULATORY_CARE_PROVIDER_SITE_OTHER): Payer: Self-pay | Admitting: Adult Health

## 2020-10-02 ENCOUNTER — Other Ambulatory Visit: Payer: Self-pay

## 2020-10-02 ENCOUNTER — Ambulatory Visit (INDEPENDENT_AMBULATORY_CARE_PROVIDER_SITE_OTHER): Payer: Medicaid Other | Admitting: Adult Health

## 2020-10-02 VITALS — BP 110/78 | HR 94 | Temp 98.1°F | Ht 69.0 in | Wt 376.0 lb

## 2020-10-02 DIAGNOSIS — F3289 Other specified depressive episodes: Secondary | ICD-10-CM

## 2020-10-02 DIAGNOSIS — E559 Vitamin D deficiency, unspecified: Secondary | ICD-10-CM | POA: Diagnosis not present

## 2020-10-02 DIAGNOSIS — Z6841 Body Mass Index (BMI) 40.0 and over, adult: Secondary | ICD-10-CM | POA: Diagnosis not present

## 2020-10-02 DIAGNOSIS — F32A Depression, unspecified: Secondary | ICD-10-CM | POA: Insufficient documentation

## 2020-10-02 MED ORDER — VITAMIN D (ERGOCALCIFEROL) 1.25 MG (50000 UNIT) PO CAPS: 50000.0000 [IU] | ORAL_CAPSULE | ORAL | 0 refills | Status: DC

## 2020-10-06 NOTE — Progress Notes (Signed)
Chief Complaint:   OBESITY Patricia Duran is here to discuss her progress with her obesity treatment plan along with follow-up of her obesity related diagnoses. Novalyn is on the Category 4 Plan and keeping a food journal and adhering to recommended goals of 350-450 calories and 30+ g protein and states she is following her eating plan approximately 90% of the time. Adore states she is doing 0 minutes 0 times per week.  Today's visit was #: 3 Starting weight: 376 lbs Starting date: 09/04/2020 Today's weight: 376 lbs Today's date: 10/02/2020 Total lbs lost to date: 0 Total lbs lost since last in-office visit: 2 lbs  Interim History: Patryce will enjoy 8 oz of orange juice with breakfast, then hydrate with water the remainder of the day. She has been going to [employer] (most days of the week)- breakfast burrito, strawberry smoothie, medium orange juice.  Interval goal: Use cooler bag to pack lunch when working. Breakfast goals of 350-450 calories and 30+ g protein  Subjective:   1. Vitamin D deficiency Patricia Duran's Vitamin D level was 12.2 on 09/04/2020. She is currently taking prescription vitamin D 50,000 IU each week. She denies nausea, vomiting or muscle weakness.   Ref. Range 09/04/2020 09:14  Vitamin D, 25-Hydroxy Latest Ref Range: 30.0 - 100.0 ng/mL 12.2 (L)   2. Other depression, with emotional eating Sofya reports stable mood. Pt denies suicidal or homicidal ideations.  Assessment/Plan:   1. Vitamin D deficiency Low Vitamin D level contributes to fatigue and are associated with obesity, breast, and colon cancer. She agrees to continue to take prescription Vitamin D @50 ,000 IU every week and will follow-up for routine testing of Vitamin D, at least 2-3 times per year to avoid over-replacement. - Vitamin D, Ergocalciferol, (DRISDOL) 1.25 MG (50000 UNIT) CAPS capsule; Take 1 capsule (50,000 Units total) by mouth every 7 (seven) days.  Dispense: 4 capsule; Refill: 0  2. Other  depression, with emotional eating Behavior modification techniques were discussed today to help Patricia Duran deal with her emotional/non-hunger eating behaviors.  Orders and follow up as documented in patient record. Continue Wellbutrin SR 150 mg daily. Pt denies need for refill at this time.  3. Class 3 severe obesity with serious comorbidity and body mass index (BMI) of 50.0 to 59.9 in adult, unspecified obesity type (HCC) Patricia Duran is currently in the action stage of change. As such, her goal is to continue with weight loss efforts. She has agreed to the Category 4 Plan and keeping a food journal and adhering to recommended goals of 350-450 calories and 30+ g protein. - Breakfast  Exercise goals: As is  Behavioral modification strategies: increasing lean protein intake, decreasing simple carbohydrates, decreasing eating out, meal planning and cooking strategies, better snacking choices and planning for success.  Patricia Duran has agreed to follow-up with our clinic in 2 weeks. She was informed of the importance of frequent follow-up visits to maximize her success with intensive lifestyle modifications for her multiple health conditions.   Objective:   Blood pressure 110/78, pulse 94, temperature 98.1 F (36.7 C), height 5\' 9"  (1.753 m), weight (!) 376 lb (170.6 kg), SpO2 97 %. Body mass index is 55.53 kg/m.  General: Cooperative, alert, well developed, in no acute distress. HEENT: Conjunctivae and lids unremarkable. Cardiovascular: Regular rhythm.  Lungs: Normal work of breathing. Neurologic: No focal deficits.   Lab Results  Component Value Date   CREATININE 0.80 09/04/2020   BUN 11 09/04/2020   NA 139 09/04/2020   K 4.1  09/04/2020   CL 104 09/04/2020   CO2 20 09/04/2020   Lab Results  Component Value Date   ALT 15 09/04/2020   AST 20 09/04/2020   ALKPHOS 68 09/04/2020   BILITOT 0.5 09/04/2020   Lab Results  Component Value Date   HGBA1C 4.8 09/04/2020   Lab Results  Component  Value Date   INSULIN 11.8 09/04/2020   Lab Results  Component Value Date   TSH 2.440 09/04/2020   Lab Results  Component Value Date   CHOL 145 09/04/2020   HDL 47 09/04/2020   LDLCALC 84 09/04/2020   TRIG 71 09/04/2020   CHOLHDL 3.1 09/04/2020   Lab Results  Component Value Date   WBC 4.7 09/04/2020   HGB 14.7 09/04/2020   HCT 44.0 09/04/2020   MCV 86 09/04/2020   PLT 264 09/04/2020   Lab Results  Component Value Date   IRON 103 09/04/2020   TIBC 343 09/04/2020   FERRITIN 124 09/04/2020    Attestation Statements:   Reviewed by clinician on day of visit: allergies, medications, problem list, medical history, surgical history, family history, social history, and previous encounter notes.  Time spent on visit including pre-visit chart review and post-visit care and charting was 32 minutes.   Edmund Hilda, am acting as Energy manager for William Hamburger, NP.  I have reviewed the above documentation for accuracy and completeness, and I agree with the above. -  Arhianna Ebey d. Gaynor Genco, NP-C

## 2020-10-15 ENCOUNTER — Ambulatory Visit (INDEPENDENT_AMBULATORY_CARE_PROVIDER_SITE_OTHER): Payer: Medicaid Other | Admitting: Family Medicine

## 2020-10-15 ENCOUNTER — Encounter (INDEPENDENT_AMBULATORY_CARE_PROVIDER_SITE_OTHER): Payer: Self-pay | Admitting: Family Medicine

## 2020-10-15 ENCOUNTER — Other Ambulatory Visit: Payer: Self-pay

## 2020-10-15 VITALS — BP 103/70 | HR 86 | Temp 98.1°F | Ht 69.0 in | Wt 379.0 lb

## 2020-10-15 DIAGNOSIS — Z6841 Body Mass Index (BMI) 40.0 and over, adult: Secondary | ICD-10-CM

## 2020-10-15 DIAGNOSIS — F3289 Other specified depressive episodes: Secondary | ICD-10-CM | POA: Diagnosis not present

## 2020-10-15 DIAGNOSIS — E559 Vitamin D deficiency, unspecified: Secondary | ICD-10-CM | POA: Diagnosis not present

## 2020-10-15 DIAGNOSIS — G4709 Other insomnia: Secondary | ICD-10-CM | POA: Diagnosis not present

## 2020-10-16 ENCOUNTER — Encounter (INDEPENDENT_AMBULATORY_CARE_PROVIDER_SITE_OTHER): Payer: Self-pay | Admitting: Family Medicine

## 2020-10-16 MED ORDER — TRAZODONE HCL 50 MG PO TABS: 50.0000 mg | ORAL_TABLET | Freq: Every day | ORAL | 0 refills | Status: DC

## 2020-10-16 MED ORDER — VITAMIN D (ERGOCALCIFEROL) 1.25 MG (50000 UNIT) PO CAPS: 50000.0000 [IU] | ORAL_CAPSULE | ORAL | 0 refills | Status: DC

## 2020-10-16 MED ORDER — BUPROPION HCL ER (XL) 150 MG PO TB24: 150.0000 mg | ORAL_TABLET | Freq: Every day | ORAL | 0 refills | Status: DC

## 2020-10-16 NOTE — Progress Notes (Signed)
Chief Complaint:   OBESITY Patricia Duran is here to discuss her progress with her obesity treatment plan along with follow-up of her obesity related diagnoses.   Today's visit was #: 4 Starting weight: 376 lbs Starting date: 09/04/2020 Today's weight: 379 lbs Today's date: 10/15/2020 Total lbs lost to date: +3 lbs Body mass index is 55.97 kg/m.   Interim History:  Patricia Duran says that Wellbutrin is helping with her cravings.  She plans to go back to the gym.  Her goal is to get 7 hours of sleep. Current Meal Plan: the Category 4 Plan for 95% of the time.  Current Exercise Plan: None at this time.  Assessment/Plan:   1. Other insomnia This is poorly controlled.  Current treatment: None.  She would like to get 7 hours of sleep at night.  Plan: Recommend sleep hygiene measures including regular sleep schedule, optimal sleep environment, and relaxing presleep rituals.  Will start trazodone 50 mg at bedtime as needed for sleep.  2. Vitamin D deficiency Not at goal. Current vitamin D is 12.2, tested on 09/04/2020. Optimal goal > 50 ng/dL.   Plan: Continue to take prescription Vitamin D @50 ,000 IU every week as prescribed.  Follow-up for routine testing of Vitamin D, at least 2-3 times per year to avoid over-replacement.  - Refill Vitamin D, Ergocalciferol, (DRISDOL) 1.25 MG (50000 UNIT) CAPS capsule; Take 1 capsule (50,000 Units total) by mouth every 7 (seven) days.  Dispense: 4 capsule; Refill: 0  3. Other depression, with emotional eating Improving, but not optimized. Medication: Wellbutrin XL 150 mg daily.  Plan:  Will refill Wellbutrin today, as per below.  Behavior modification techniques were discussed today to help deal with emotional/non-hunger eating behaviors.  - Refill buPROPion (WELLBUTRIN XL) 150 MG 24 hr tablet; Take 1 tablet (150 mg total) by mouth daily.  Dispense: 90 tablet; Refill: 0  4. Class 3 severe obesity with serious comorbidity and body mass index (BMI) of 50.0  to 59.9 in adult, unspecified obesity type (HCC)  Course: Patricia Duran is currently in the action stage of change. As such, her goal is to continue with weight loss efforts.   Nutrition goals: She has agreed to the Category 4 Plan.   Exercise goals: For substantial health benefits, adults should do at least 150 minutes (2 hours and 30 minutes) a week of moderate-intensity, or 75 minutes (1 hour and 15 minutes) a week of vigorous-intensity aerobic physical activity, or an equivalent combination of moderate- and vigorous-intensity aerobic activity. Aerobic activity should be performed in episodes of at least 10 minutes, and preferably, it should be spread throughout the week.  Behavioral modification strategies: increasing lean protein intake, decreasing simple carbohydrates, increasing vegetables and increasing water intake.  Patricia Duran has agreed to follow-up with our clinic in 3 weeks. She was informed of the importance of frequent follow-up visits to maximize her success with intensive lifestyle modifications for her multiple health conditions.   Objective:   Blood pressure 103/70, pulse 86, temperature 98.1 F (36.7 C), temperature source Oral, height 5\' 9"  (1.753 m), weight (!) 379 lb (171.9 kg), SpO2 97 %. Body mass index is 55.97 kg/m.  General: Cooperative, alert, well developed, in no acute distress. HEENT: Conjunctivae and lids unremarkable. Cardiovascular: Regular rhythm.  Lungs: Normal work of breathing. Neurologic: No focal deficits.   Lab Results  Component Value Date   CREATININE 0.80 09/04/2020   BUN 11 09/04/2020   NA 139 09/04/2020   K 4.1 09/04/2020   CL  104 09/04/2020   CO2 20 09/04/2020   Lab Results  Component Value Date   ALT 15 09/04/2020   AST 20 09/04/2020   ALKPHOS 68 09/04/2020   BILITOT 0.5 09/04/2020   Lab Results  Component Value Date   HGBA1C 4.8 09/04/2020   Lab Results  Component Value Date   INSULIN 11.8 09/04/2020   Lab Results  Component  Value Date   TSH 2.440 09/04/2020   Lab Results  Component Value Date   CHOL 145 09/04/2020   HDL 47 09/04/2020   LDLCALC 84 09/04/2020   TRIG 71 09/04/2020   CHOLHDL 3.1 09/04/2020   Lab Results  Component Value Date   WBC 4.7 09/04/2020   HGB 14.7 09/04/2020   HCT 44.0 09/04/2020   MCV 86 09/04/2020   PLT 264 09/04/2020   Lab Results  Component Value Date   IRON 103 09/04/2020   TIBC 343 09/04/2020   FERRITIN 124 09/04/2020   Attestation Statements:   Reviewed by clinician on day of visit: allergies, medications, problem list, medical history, surgical history, family history, social history, and previous encounter notes.  I, Insurance claims handler, CMA, am acting as transcriptionist for Helane Rima, DO  I have reviewed the above documentation for accuracy and completeness, and I agree with the above. Helane Rima, DO

## 2020-10-30 ENCOUNTER — Encounter (INDEPENDENT_AMBULATORY_CARE_PROVIDER_SITE_OTHER): Payer: Self-pay | Admitting: Adult Health

## 2020-10-30 ENCOUNTER — Ambulatory Visit (INDEPENDENT_AMBULATORY_CARE_PROVIDER_SITE_OTHER): Payer: Medicaid Other | Admitting: Adult Health

## 2020-10-30 ENCOUNTER — Other Ambulatory Visit: Payer: Self-pay

## 2020-10-30 VITALS — BP 112/75 | HR 73 | Temp 98.2°F | Ht 69.0 in | Wt 382.0 lb

## 2020-10-30 DIAGNOSIS — E559 Vitamin D deficiency, unspecified: Secondary | ICD-10-CM | POA: Diagnosis not present

## 2020-10-30 DIAGNOSIS — Z6841 Body Mass Index (BMI) 40.0 and over, adult: Secondary | ICD-10-CM

## 2020-10-30 DIAGNOSIS — F3289 Other specified depressive episodes: Secondary | ICD-10-CM | POA: Diagnosis not present

## 2020-10-30 DIAGNOSIS — A6004 Herpesviral vulvovaginitis: Secondary | ICD-10-CM | POA: Diagnosis not present

## 2020-10-30 MED ORDER — VITAMIN D (ERGOCALCIFEROL) 1.25 MG (50000 UNIT) PO CAPS: 50000.0000 [IU] | ORAL_CAPSULE | ORAL | 0 refills | Status: DC

## 2020-11-03 NOTE — Progress Notes (Signed)
Chief Complaint:   OBESITY Patricia Duran is here to discuss her progress with her obesity treatment plan along with follow-up of her obesity related diagnoses. Patricia Duran is on the Category 4 Plan and states she is following her eating plan approximately 75-80% of the time. Patricia Duran states she is doing 0 minutes 0 times per week.  Today's visit was #: 5 Starting weight: 376 lbs Starting date: 09/04/2020 Today's weight: 382 lbs Today's date: 10/30/2020 Total lbs lost to date: 0 Total lbs lost since last in-office visit: 0  Interim History: Patricia Duran reports an increase in stress with following life events.  1. Short staffed at [facility], which has required her to work up to 60 hour a week 2. Recent exacerbation of HSV II. She was diagnosed Nov 2021. She increased Valtrex from 1000 mg QD to BID during outbreak.  Subjective:   1. Vitamin D deficiency Patricia Duran's Vitamin D level was well below goal of 50, at  12.2 on 09/04/2020. She is currently taking prescription vitamin D 50,000 IU each week. She denies nausea, vomiting or muscle weakness.   Ref. Range 09/04/2020 09:14  Vitamin D, 25-Hydroxy Latest Ref Range: 30.0 - 100.0 ng/mL 12.2 (L)   2. Other depression, with emotional eating Patricia Duran's BP and heart rate are at goal at OV. She is on Wellbutrin XL 150 mg QD. She denies suicidal or homicidal ideations.  3. Herpes simplex vulvovaginitis Patricia Duran had a recent exacerbation for the last week. She increased her Valtrex 1000 mg QD to BID during acute symptoms.   Assessment/Plan:   1. Vitamin D deficiency Low Vitamin D level contributes to fatigue and are associated with obesity, breast, and colon cancer. She agrees to continue to take prescription Vitamin D @50 ,000 IU every week and will follow-up for routine testing of Vitamin D, at least 2-3 times per year to avoid over-replacement.  - Vitamin D, Ergocalciferol, (DRISDOL) 1.25 MG (50000 UNIT) CAPS capsule; Take 1 capsule (50,000 Units total) by  mouth every 7 (seven) days.  Dispense: 4 capsule; Refill: 0  2. Other depression, with emotional eating Behavior modification techniques were discussed today to help Patricia Duran deal with her emotional/non-hunger eating behaviors.  Orders and follow up as documented in patient record. Continue Wellbutrin XL 150 mg QD.  3. Herpes simplex vulvovaginitis Continue Valtrex as directed. Avoid sexual contact during outbreak.  4. Class 3 severe obesity with serious comorbidity and body mass index (BMI) of 50.0 to 59.9 in adult, unspecified obesity type (HCC) Patricia Duran is currently in the action stage of change. As such, her goal is to continue with weight loss efforts. She has agreed to the Category 4 Plan.   Pack snacks/lunch to avoid fast food meals.  Exercise goals: No exercise has been prescribed at this time.  Behavioral modification strategies: increasing lean protein intake, decreasing simple carbohydrates, meal planning and cooking strategies and planning for success.  Patricia Duran has agreed to follow-up with our clinic in 2 weeks. She was informed of the importance of frequent follow-up visits to maximize her success with intensive lifestyle modifications for her multiple health conditions.   Objective:   Blood pressure 112/75, pulse 73, temperature 98.2 F (36.8 C), height 5\' 9"  (1.753 m), weight (!) 382 lb (173.3 kg), SpO2 99 %. Body mass index is 56.41 kg/m.  General: Cooperative, alert, well developed, in no acute distress. HEENT: Conjunctivae and lids unremarkable. Cardiovascular: Regular rhythm.  Lungs: Normal work of breathing. Neurologic: No focal deficits.   Lab Results  Component Value  Date   CREATININE 0.80 09/04/2020   BUN 11 09/04/2020   NA 139 09/04/2020   K 4.1 09/04/2020   CL 104 09/04/2020   CO2 20 09/04/2020   Lab Results  Component Value Date   ALT 15 09/04/2020   AST 20 09/04/2020   ALKPHOS 68 09/04/2020   BILITOT 0.5 09/04/2020   Lab Results  Component  Value Date   HGBA1C 4.8 09/04/2020   Lab Results  Component Value Date   INSULIN 11.8 09/04/2020   Lab Results  Component Value Date   TSH 2.440 09/04/2020   Lab Results  Component Value Date   CHOL 145 09/04/2020   HDL 47 09/04/2020   LDLCALC 84 09/04/2020   TRIG 71 09/04/2020   CHOLHDL 3.1 09/04/2020   Lab Results  Component Value Date   WBC 4.7 09/04/2020   HGB 14.7 09/04/2020   HCT 44.0 09/04/2020   MCV 86 09/04/2020   PLT 264 09/04/2020   Lab Results  Component Value Date   IRON 103 09/04/2020   TIBC 343 09/04/2020   FERRITIN 124 09/04/2020   Attestation Statements:   Reviewed by clinician on day of visit: allergies, medications, problem list, medical history, surgical history, family history, social history, and previous encounter notes.  Time spent on visit including pre-visit chart review and post-visit care and charting was 33 minutes.   Edmund Hilda, am acting as Energy manager for William Hamburger, NP.  I have reviewed the above documentation for accuracy and completeness, and I agree with the above. - Katy d. Danford. NP-C

## 2020-11-05 ENCOUNTER — Ambulatory Visit: Payer: Medicaid Other

## 2020-11-07 ENCOUNTER — Ambulatory Visit: Payer: Medicaid Other

## 2020-11-07 ENCOUNTER — Encounter: Payer: Medicaid Other | Admitting: Obstetrics

## 2020-11-10 ENCOUNTER — Other Ambulatory Visit: Payer: Self-pay

## 2020-11-10 ENCOUNTER — Ambulatory Visit (INDEPENDENT_AMBULATORY_CARE_PROVIDER_SITE_OTHER): Payer: Medicaid Other

## 2020-11-10 VITALS — BP 149/91 | Ht 69.0 in | Wt 392.0 lb

## 2020-11-10 DIAGNOSIS — N946 Dysmenorrhea, unspecified: Secondary | ICD-10-CM

## 2020-11-10 DIAGNOSIS — Z3042 Encounter for surveillance of injectable contraceptive: Secondary | ICD-10-CM

## 2020-11-10 MED ORDER — MEDROXYPROGESTERONE ACETATE 150 MG/ML IM SUSP
150.0000 mg | Freq: Once | INTRAMUSCULAR | Status: AC
Start: 2020-11-10 — End: 2020-11-10
  Administered 2020-11-10: 150 mg via INTRAMUSCULAR

## 2020-11-10 MED ORDER — MEDROXYPROGESTERONE ACETATE 150 MG/ML IM SUSP: INTRAMUSCULAR | 4 refills | Status: DC

## 2020-11-10 NOTE — Progress Notes (Signed)
Pt presents today for Depo Provera injection. Depo 150mg  given IM Left deltoid. Pt tolerated well. No adverse side effects noted. Pt is up to date on yearly exam. Next depo due 01/26/21-02/09/21. Pt will set up injection at upcoming appointment on 11/21/20.

## 2020-11-20 ENCOUNTER — Ambulatory Visit (INDEPENDENT_AMBULATORY_CARE_PROVIDER_SITE_OTHER): Payer: Medicaid Other | Admitting: Family Medicine

## 2020-11-21 ENCOUNTER — Other Ambulatory Visit: Payer: Self-pay

## 2020-11-21 ENCOUNTER — Encounter: Payer: Self-pay | Admitting: Obstetrics

## 2020-11-21 ENCOUNTER — Other Ambulatory Visit (HOSPITAL_COMMUNITY)
Admission: RE | Admit: 2020-11-21 | Discharge: 2020-11-21 | Disposition: A | Discharge status: Home / Self Care | Payer: Medicaid Other | Source: Ambulatory Visit | Attending: Obstetrics | Admitting: Obstetrics

## 2020-11-21 ENCOUNTER — Ambulatory Visit: Payer: Medicaid Other | Admitting: Obstetrics

## 2020-11-21 VITALS — BP 124/81 | HR 91 | Wt 388.0 lb

## 2020-11-21 DIAGNOSIS — R87612 Low grade squamous intraepithelial lesion on cytologic smear of cervix (LGSIL): Secondary | ICD-10-CM | POA: Insufficient documentation

## 2020-11-21 DIAGNOSIS — Z01812 Encounter for preprocedural laboratory examination: Secondary | ICD-10-CM

## 2020-11-21 NOTE — Addendum Note (Signed)
Addended by: Coral Ceo A on: 11/21/2020 01:00 PM   Modules accepted: Level of Service

## 2020-11-21 NOTE — Progress Notes (Signed)
GYN presents for COLPO. PAP LSIL.

## 2020-11-21 NOTE — Progress Notes (Signed)
Colposcopy Procedure Note  Indications: Pap smear 2 months ago showed: low-grade squamous intraepithelial neoplasia (LGSIL - encompassing HPV,mild dysplasia,CIN I). The prior pap showed low-grade squamous intraepithelial neoplasia (LGSIL - encompassing HPV,mild dysplasia,CIN I).  Prior cervical/vaginal disease: CIN 1. Prior cervical treatment: no treatment.  Procedure Details  The risks and benefits of the procedure and Written informed consent obtained.  A time-out was performed confirming the patient, procedure and allergy status  Speculum placed in vagina and excellent visualization of cervix achieved, cervix swabbed x 3 with acetic acid solution.  Findings: Cervix: no mosaicism, no punctation, no abnormal vasculature and acetowhite lesion(s) noted at 12 o'clock; SCJ visualized - lesion at 12 o'clock, endocervical curettage performed, cervical biopsies taken at 6 and 12 o'clock, specimen labelled and sent to pathology and hemostasis achieved with Monsel's solution.   Vaginal inspection: normal without visible lesions. Vulvar colposcopy: vulvar colposcopy not performed.   Physical Exam   Specimens: ECC and Cervical Biopsies  Complications: none.  Plan: Specimens labelled and sent to Pathology. Will base further treatment on Pathology findings. Post biopsy instructions given to patient. Return to discuss Pathology results in 2 weeks.  Brock Bad, MD 11/21/2020 8:59 AM

## 2020-11-24 LAB — POCT URINE PREGNANCY: Preg Test, Ur: NEGATIVE

## 2020-11-24 LAB — SURGICAL PATHOLOGY

## 2020-12-04 ENCOUNTER — Ambulatory Visit (INDEPENDENT_AMBULATORY_CARE_PROVIDER_SITE_OTHER): Payer: Medicaid Other | Admitting: Adult Health

## 2021-01-26 ENCOUNTER — Ambulatory Visit (INDEPENDENT_AMBULATORY_CARE_PROVIDER_SITE_OTHER): Payer: Medicaid Other

## 2021-01-26 ENCOUNTER — Other Ambulatory Visit: Payer: Self-pay

## 2021-01-26 DIAGNOSIS — Z3042 Encounter for surveillance of injectable contraceptive: Secondary | ICD-10-CM | POA: Diagnosis not present

## 2021-01-26 MED ORDER — MEDROXYPROGESTERONE ACETATE 150 MG/ML IM SUSP
150.0000 mg | INTRAMUSCULAR | Status: AC
  Administered 2021-01-26 – 2024-07-30 (×4): 150 mg via INTRAMUSCULAR

## 2021-01-26 NOTE — Progress Notes (Addendum)
Patient presents for Depo Injection Last injection was 11/10/20 in Rt Deltoid   Injection given in Left  Deltoid w/o any problems  Pt advised to make next appt upfront Due 04/13/21-04/27/21  Pt voiced understanding.    Brock Bad, MD 01/26/2021 11:09 AM

## 2021-03-08 ENCOUNTER — Other Ambulatory Visit: Payer: Self-pay

## 2021-03-08 ENCOUNTER — Encounter (HOSPITAL_COMMUNITY): Payer: Self-pay

## 2021-03-08 ENCOUNTER — Ambulatory Visit (HOSPITAL_COMMUNITY)
Admission: EM | Admit: 2021-03-08 | Discharge: 2021-03-08 | Disposition: A | Discharge status: Home / Self Care | Payer: Medicaid Other | Attending: Physician Assistant | Admitting: Physician Assistant

## 2021-03-08 DIAGNOSIS — R300 Dysuria: Secondary | ICD-10-CM

## 2021-03-08 DIAGNOSIS — R3915 Urgency of urination: Secondary | ICD-10-CM | POA: Insufficient documentation

## 2021-03-08 DIAGNOSIS — N3090 Cystitis, unspecified without hematuria: Secondary | ICD-10-CM

## 2021-03-08 DIAGNOSIS — R350 Frequency of micturition: Secondary | ICD-10-CM | POA: Diagnosis not present

## 2021-03-08 LAB — POCT URINALYSIS DIPSTICK, ED / UC
Bilirubin Urine: NEGATIVE
Glucose, UA: NEGATIVE mg/dL
Ketones, ur: NEGATIVE mg/dL
Nitrite: NEGATIVE
Protein, ur: NEGATIVE mg/dL
Specific Gravity, Urine: 1.02 (ref 1.005–1.030)
Urobilinogen, UA: 0.2 mg/dL (ref 0.0–1.0)
pH: 7 (ref 5.0–8.0)

## 2021-03-08 MED ORDER — NITROFURANTOIN MONOHYD MACRO 100 MG PO CAPS: 100.0000 mg | ORAL_CAPSULE | Freq: Two times a day (BID) | ORAL | 0 refills | Status: DC

## 2021-03-08 NOTE — ED Triage Notes (Signed)
Pt presents with c/o painful urination, urinary frequency and urgency X 3 days. Denies abdominal pain.

## 2021-03-08 NOTE — Discharge Instructions (Addendum)
Take nitrofurantoin twice daily for 5 days to cover for urinary tract infection.  We sent your urine off for culture and if we need to change antibiotics based on this result we will contact you.  Please make sure you are drinking plenty of fluid.  If you have any worsening symptoms including blood in your urine, pelvic pain, abdominal pain, nausea, vomiting, fever you need to be seen immediately.  I would recommend that you have your urine rechecked by either our clinic or your primary care provider within a few weeks to ensure that blood noted today resolves once the infection is cleared.

## 2021-03-08 NOTE — ED Provider Notes (Signed)
[facility]    CSN: [id number] Arrival date & time: 03/08/21  1439      History   Chief Complaint Chief Complaint  Patient presents with   Dysuria   Urinary Frequency   Urinary Urgency    HPI Patricia Duran is a 25 y.o. female.   Patient presents today with a 2-day history of UTI symptoms.  Reports dysuria, urinary frequency, urinary urgency.  She denies any abdominal pain, pelvic pain, vaginal discharge, vaginal bleeding.  She does have a history of genital herpes and states she may be beginning of outbreak but this would not typically translate to UTI symptoms.  She denies any recent antibiotic use.  She has no concern for STI.  She is confident she is not pregnant.  She denies history of recurrent urinary tract infections, nephrolithiasis, single kidney, catheter use, recent urogenital procedure.   Past Medical History:  Diagnosis Date   Asthma    Back pain    GERD (gastroesophageal reflux disease)    HSV-2 (herpes simplex virus 2) infection    Muscle spasm    Obesity    Thyroid disease     Patient Active Problem List   Diagnosis Date Noted   Herpes simplex vulvovaginitis 10/30/2020   Vitamin D deficiency 10/02/2020   Depression 10/02/2020   Class 3 severe obesity with serious comorbidity and body mass index (BMI) of 50.0 to 59.9 in adult [facility]) 10/02/2020   Dysmenorrhea 05/27/2016   Abnormal echocardiogram findings without diagnosis 03/23/2013    Past Surgical History:  Procedure Laterality Date   TOE SURGERY     WISDOM TOOTH EXTRACTION      OB History     Gravida  0   Para  0   Term  0   Preterm  0   AB  0   Living  0      SAB  0   IAB  0   Ectopic  0   Multiple  0   Live Births  0            Home Medications    Prior to Admission medications   Medication Sig Start Date End Date Taking? Authorizing Provider  nitrofurantoin, macrocrystal-monohydrate, (MACROBID) 100 MG capsule Take 1 capsule (100 mg total) by  mouth 2 (two) times daily. 03/08/21  Yes Nalani Andreen, Noberto Retort, PA-C  valACYclovir (VALTREX) 1000 MG tablet Take 1 tablet (1,000 mg total) by mouth 2 (two) times daily. 07/04/20  Yes Brock Bad, MD  buPROPion (WELLBUTRIN XL) 150 MG 24 hr tablet Take 1 tablet (150 mg total) by mouth daily. 10/16/20   Helane Rima, DO  medroxyPROGESTERone (DEPO-PROVERA) 150 MG/ML injection INJECT IN THE MUSCLE EVERY 3 MONTHS 11/10/20   Brock Bad, MD  meloxicam (MOBIC) 15 MG tablet Take 15 mg by mouth daily.    [provider]  metroNIDAZOLE (FLAGYL) 500 MG tablet Take 1 tablet (500 mg total) by mouth 2 (two) times daily. Patient not taking: Reported on 11/10/2020 09/04/20   Brock Bad, MD  tiZANidine (ZANAFLEX) 4 MG tablet Take 4 mg by mouth every 6 (six) hours as needed. 02/14/21   [provider]  traZODone (DESYREL) 50 MG tablet Take 1 tablet (50 mg total) by mouth at bedtime. Patient not taking: Reported on 11/10/2020 10/16/20   Helane Rima, DO  Vitamin D, Ergocalciferol, (DRISDOL) 1.25 MG (50000 UNIT) CAPS capsule Take 1 capsule (50,000 Units total) by mouth every 7 (seven) days. 10/30/20  William Hamburger D, NP  Norethin Ace-Eth Estrad-FE (TAYTULLA) 1-20 MG-MCG(24) CAPS Take 1 capsule by mouth daily before breakfast. 07/11/19 08/09/19  Brock Bad, MD    Family History Family History  Problem Relation Age of Onset   Asthma Other    Hypertension Other    Hyperlipidemia Other    Sleep apnea Other    Stroke Other    Heart attack Other    Hypertension Mother    Asthma Mother    Sleep apnea Mother    Obesity Mother    Hypertension Father     Social History Social History   Tobacco Use   Smoking status: Every Day    Types: Cigars   Smokeless tobacco: Never   Tobacco comments:    black and milds  Vaping Use   Vaping Use: Never used  Substance Use Topics   Alcohol use: Yes    Comment: occ   Drug use: Yes    Types: Marijuana    Comment: daily      Allergies   Patient has no known allergies.   Review of Systems Review of Systems  Constitutional:  Negative for activity change, appetite change, fatigue and fever.  Respiratory:  Negative for cough and shortness of breath.   Cardiovascular:  Negative for chest pain.  Gastrointestinal:  Negative for abdominal pain, diarrhea, nausea and vomiting.  Genitourinary:  Positive for dysuria, frequency and urgency. Negative for genital sores, hematuria, pelvic pain, vaginal bleeding and vaginal pain.  Neurological:  Negative for dizziness, light-headedness and headaches.    Physical Exam Triage Vital Signs ED Triage Vitals  Enc Vitals Group     BP 03/08/21 1550 137/66     Pulse Rate 03/08/21 1549 86     Resp 03/08/21 1549 20     Temp 03/08/21 1549 98.8 F (37.1 C)     Temp Source 03/08/21 1549 Oral     SpO2 03/08/21 1549 100 %     Weight --      Height --      Head Circumference --      Peak Flow --      Pain Score 03/08/21 1546 0     Pain Loc --      Pain Edu? --      Excl. in GC? --    No data found.  Updated Vital Signs BP 137/86 (BP Location: Right Arm)   Pulse 86   Temp 98.8 F (37.1 C) (Oral)   Resp 20   LMP  (LMP Unknown) Comment: Does not have menstrual cycles.  SpO2 100%   Visual Acuity Right Eye Distance:   Left Eye Distance:   Bilateral Distance:    Right Eye Near:   Left Eye Near:    Bilateral Near:     Physical Exam Constitutional:      General: She is awake. She is not in acute distress.    Appearance: Normal appearance. She is not ill-appearing.     Comments: Very pleasant female appears stated age in no acute distress sitting comfortably in exam room  HENT:     Head: Normocephalic and atraumatic.  Cardiovascular:     Rate and Rhythm: Normal rate and regular rhythm.     Heart sounds: Normal heart sounds, S1 normal and S2 normal. No murmur heard. Pulmonary:     Effort: Pulmonary effort is normal.     Breath sounds: Normal breath sounds.  No wheezing, rhonchi or rales.     Comments: Clear  to auscultation bilaterally Abdominal:     General: Bowel sounds are normal.     Palpations: Abdomen is soft.     Tenderness: There is no abdominal tenderness. There is no right CVA tenderness, left CVA tenderness, guarding or rebound.     Comments: Benign abdominal exam.  No evidence of acute abdomen on physical exam.  No CVA tenderness.  Genitourinary:    Comments: Exam deferred Psychiatric:        Behavior: Behavior is cooperative.     UC Treatments / Results  Labs (all labs ordered are listed, but only abnormal results are displayed) Labs Reviewed  POCT URINALYSIS DIPSTICK, ED / UC - Abnormal; Notable for the following components:      Result Value   Hgb urine dipstick SMALL (*)    Leukocytes,Ua SMALL (*)    All other components within normal limits  URINE CULTURE    EKG   Radiology No results found.  Procedures Procedures (including critical care time)  Medications Ordered in UC Medications - No data to display  Initial Impression / Assessment and Plan / UC Course  I have reviewed the triage vital signs and the nursing notes.  Pertinent labs & imaging results that were available during my care of the patient were reviewed by me and considered in my medical decision making (see chart for details).      UA showed hemoglobin and leukocyte Estrace.  Patient was empirically treated with Macrobid.  Urine culture obtained-results pending.  Discussed potential need to change antibiotics to susceptibilities identified on culture.  Recommend she drink plenty of fluid.  Discussed alarm symptoms that warrant emergent evaluation.  Encouraged her to follow-up with our clinic or primary care provider within a few weeks to repeat UA and ensure that hematuria noted today resolves with clearing of infection.  Strict return precautions given to which patient expressed understanding.  Final Clinical Impressions(s) / UC Diagnoses    Final diagnoses:  Cystitis  Dysuria  Urinary frequency  Urinary urgency     Discharge Instructions      Take nitrofurantoin twice daily for 5 days to cover for urinary tract infection.  We sent your urine off for culture and if we need to change antibiotics based on this result we will contact you.  Please make sure you are drinking plenty of fluid.  If you have any worsening symptoms including blood in your urine, pelvic pain, abdominal pain, nausea, vomiting, fever you need to be seen immediately.  I would recommend that you have your urine rechecked by either our clinic or your primary care provider within a few weeks to ensure that blood noted today resolves once the infection is cleared.     ED Prescriptions     Medication Sig Dispense Auth. Provider   nitrofurantoin, macrocrystal-monohydrate, (MACROBID) 100 MG capsule Take 1 capsule (100 mg total) by mouth 2 (two) times daily. 10 capsule Jeymi Hepp, Noberto Retort, PA-C      PDMP not reviewed this encounter.   Jeani Hawking, PA-C 03/08/21 1613

## 2021-03-10 LAB — URINE CULTURE: Culture: >=100000 — AB

## 2021-03-24 ENCOUNTER — Other Ambulatory Visit: Payer: Self-pay

## 2021-03-24 ENCOUNTER — Emergency Department (HOSPITAL_BASED_OUTPATIENT_CLINIC_OR_DEPARTMENT_OTHER)
Admission: EM | Admit: 2021-03-24 | Discharge: 2021-03-24 | Disposition: A | Discharge status: Home / Self Care | Payer: Medicaid Other | Attending: Emergency Medicine | Admitting: Emergency Medicine

## 2021-03-24 ENCOUNTER — Encounter (HOSPITAL_BASED_OUTPATIENT_CLINIC_OR_DEPARTMENT_OTHER): Payer: Self-pay

## 2021-03-24 DIAGNOSIS — X58XXXA Exposure to other specified factors, initial encounter: Secondary | ICD-10-CM | POA: Insufficient documentation

## 2021-03-24 DIAGNOSIS — J45909 Unspecified asthma, uncomplicated: Secondary | ICD-10-CM | POA: Diagnosis not present

## 2021-03-24 DIAGNOSIS — T161XXA Foreign body in right ear, initial encounter: Secondary | ICD-10-CM | POA: Diagnosis not present

## 2021-03-24 DIAGNOSIS — F17290 Nicotine dependence, other tobacco product, uncomplicated: Secondary | ICD-10-CM | POA: Diagnosis not present

## 2021-03-24 MED ORDER — LIDOCAINE VISCOUS HCL 2 % MT SOLN
5.0000 mL | Freq: Once | OROMUCOSAL | Status: AC
  Administered 2021-03-24: 5 mL via OROMUCOSAL

## 2021-03-24 NOTE — ED Provider Notes (Signed)
[facility] EMERGENCY DEPT Provider Note   CSN: [id number] Arrival date & time: 03/24/21  0143     History Chief Complaint  Patient presents with   Foreign Body in Ear    Right    Patricia Duran is a 25 y.o. female.  The history is provided by the patient.  Foreign Body in Ear This is a new problem. The current episode started 3 to 5 hours ago. The problem occurs constantly. The problem has been gradually worsening. Nothing aggravates the symptoms. Relieved by: vegetable oil.  Patient believes there is an insect in her right ear.  She felt it moving around.  She placed oil in the ear and it stopped moving    Past Medical History:  Diagnosis Date   Asthma    Back pain    GERD (gastroesophageal reflux disease)    HSV-2 (herpes simplex virus 2) infection    Muscle spasm    Obesity    Thyroid disease     Patient Active Problem List   Diagnosis Date Noted   Herpes simplex vulvovaginitis 10/30/2020   Vitamin D deficiency 10/02/2020   Depression 10/02/2020   Class 3 severe obesity with serious comorbidity and body mass index (BMI) of 50.0 to 59.9 in adult [facility]) 10/02/2020   Dysmenorrhea 05/27/2016   Abnormal echocardiogram findings without diagnosis 03/23/2013    Past Surgical History:  Procedure Laterality Date   TOE SURGERY     WISDOM TOOTH EXTRACTION       OB History     Gravida  0   Para  0   Term  0   Preterm  0   AB  0   Living  0      SAB  0   IAB  0   Ectopic  0   Multiple  0   Live Births  0           Family History  Problem Relation Age of Onset   Asthma Other    Hypertension Other    Hyperlipidemia Other    Sleep apnea Other    Stroke Other    Heart attack Other    Hypertension Mother    Asthma Mother    Sleep apnea Mother    Obesity Mother    Hypertension Father     Social History   Tobacco Use   Smoking status: Every Day    Types: Cigars   Smokeless tobacco: Never   Tobacco comments:     black and milds  Vaping Use   Vaping Use: Never used  Substance Use Topics   Alcohol use: Yes    Comment: occ   Drug use: Yes    Types: Marijuana    Comment: daily    Home Medications Prior to Admission medications   Medication Sig Start Date End Date Taking? Authorizing Provider  buPROPion (WELLBUTRIN XL) 150 MG 24 hr tablet Take 1 tablet (150 mg total) by mouth daily. 10/16/20   Helane Rima, DO  medroxyPROGESTERone (DEPO-PROVERA) 150 MG/ML injection INJECT IN THE MUSCLE EVERY 3 MONTHS 11/10/20   Brock Bad, MD  meloxicam (MOBIC) 15 MG tablet Take 15 mg by mouth daily.    [provider]  metroNIDAZOLE (FLAGYL) 500 MG tablet Take 1 tablet (500 mg total) by mouth 2 (two) times daily. Patient not taking: Reported on 11/10/2020 09/04/20   Brock Bad, MD  nitrofurantoin, macrocrystal-monohydrate, (MACROBID) 100 MG capsule Take 1 capsule (100 mg total) by  mouth 2 (two) times daily. 03/08/21   Raspet, Noberto Retort, PA-C  tiZANidine (ZANAFLEX) 4 MG tablet Take 4 mg by mouth every 6 (six) hours as needed. 02/14/21   [provider]  traZODone (DESYREL) 50 MG tablet Take 1 tablet (50 mg total) by mouth at bedtime. Patient not taking: Reported on 11/10/2020 10/16/20   Helane Rima, DO  valACYclovir (VALTREX) 1000 MG tablet Take 1 tablet (1,000 mg total) by mouth 2 (two) times daily. 07/04/20   Brock Bad, MD  Vitamin D, Ergocalciferol, (DRISDOL) 1.25 MG (50000 UNIT) CAPS capsule Take 1 capsule (50,000 Units total) by mouth every 7 (seven) days. 10/30/20   William Hamburger D, NP  Norethin Ace-Eth Estrad-FE (TAYTULLA) 1-20 MG-MCG(24) CAPS Take 1 capsule by mouth daily before breakfast. 07/11/19 08/09/19  Brock Bad, MD    Allergies    Patient has no known allergies.  Review of Systems   Review of Systems  Constitutional:  Negative for fever.  HENT:  Positive for ear pain.    Physical Exam Updated Vital Signs BP (!) 146/96 (BP Location: Left Arm)    Pulse 100   Temp 99.1 F (37.3 C) (Oral)   Resp 16   Ht 1.753 m (5\' 9" )   Wt (!) 176 kg   LMP  (LMP Unknown) Comment: Does not have menstrual cycles.  SpO2 100%   BMI 57.30 kg/m   Physical Exam CONSTITUTIONAL: Well developed/well nourished HEAD: Normocephalic/atraumatic EYES: EOMI/PERRL ENMT: Mucous membranes moist, left TM clear and intact.  Right TM is intact.  There is a insect in the ear canal.  No active movement NECK: supple no meningeal signs LUNGS:  no apparent distress ABDOMEN: soft, obese NEURO: Pt is awake/alert/appropriate, moves all extremitiesx4.  No facial droop.   EXTREMITIES:  full ROM SKIN: warm, color normal PSYCH: no abnormalities of mood noted, alert and oriented to situation  ED Results / Procedures / Treatments   Labs (all labs ordered are listed, but only abnormal results are displayed) Labs Reviewed - No data to display  EKG None  Radiology No results found.  Procedures .Foreign Body Removal  Date/Time: 03/24/2021 5:30 AM Performed by: 05/24/2021, MD Authorized by: Zadie Rhine, MD  Consent: Verbal consent obtained. Consent given by: patient Body area: ear Location details: right ear Patient cooperative: yes Removal mechanism: alligator forceps, suction and irrigation Complexity: complex Post-procedure assessment: residual foreign bodies remain Patient tolerance: patient tolerated the procedure well with no immediate complications Comments: Patient with unknown insect in her right ear.  The insect was dead.  Multiple attempts at removal were only partially successful.  We were able to remove part of the insect.  We suctioned, irrigated and also attempted alligator forceps to remove the insect but were unable to fully remove the insect.  Patient tolerated procedure well.  She is in no acute pain.  No bleeding is noted.    Medications Ordered in ED Medications - No data to display  ED Course  I have reviewed the triage vital  signs and the nursing notes.    MDM Rules/Calculators/A&P                          Patient with an insect that is in her right ear.  Multiple attempts at removal were only partially successful. Patient tolerated procedure well. Patient preferred to be discharged home.  Will refer to otolaryngology for close follow-up and removal. Final Clinical Impression(s) / ED Diagnoses  Final diagnoses:  Foreign body of right ear, initial encounter    Rx / DC Orders ED Discharge Orders     None        Zadie Rhine, MD 03/24/21 [phone]

## 2021-03-24 NOTE — ED Triage Notes (Signed)
Patient here POV from Home with Foreign Object in Right Ear.   Patient believes she has a Live Roach in Right Ear from Approximately 1 Hour PTA. Patient warmed Vegetable Oil and placed a few drops in Ear in an attempt to have insect exit.   Ambulatory, GCS 15.

## 2021-03-25 ENCOUNTER — Telehealth: Payer: Self-pay

## 2021-03-25 NOTE — Telephone Encounter (Signed)
Transition Care Management Follow-up Telephone Call Date of discharge and from where: 03/24/2021-[facility] How have you been since you were released from the hospital? Patient stated she is doing fine.  Any questions or concerns? No  Items Reviewed: Did the pt receive and understand the discharge instructions provided? Yes  Medications obtained and verified?  No medications given at discharge Other? No  Any new allergies since your discharge? No  Dietary orders reviewed? N/A Do you have support at home? Yes   Home Care and Equipment/Supplies: Were home health services ordered? not applicable If so, what is the name of the agency? N/A  Has the agency set up a time to come to the patient's home? not applicable Were any new equipment or medical supplies ordered?  No What is the name of the medical supply agency? N/A Were you able to get the supplies/equipment? not applicable Do you have any questions related to the use of the equipment or supplies? No  Functional Questionnaire: (I = Independent and D = Dependent) ADLs: I  Bathing/Dressing- I  Meal Prep- I  Eating- I  Maintaining continence- I  Transferring/Ambulation- I  Managing Meds- I  Follow up appointments reviewed:  PCP Hospital f/u appt confirmed? No   Specialist Hospital f/u appt confirmed? Yes  Scheduled to see [facility] on 03/24/2021. Are transportation arrangements needed? No  If their condition worsens, is the pt aware to call PCP or go to the Emergency Dept.? Yes Was the patient provided with contact information for the PCP's office or ED? Yes Was to pt encouraged to call back with questions or concerns? Yes

## 2021-04-06 ENCOUNTER — Other Ambulatory Visit: Payer: Self-pay

## 2021-04-06 ENCOUNTER — Encounter (HOSPITAL_BASED_OUTPATIENT_CLINIC_OR_DEPARTMENT_OTHER): Payer: Self-pay | Admitting: Emergency Medicine

## 2021-04-06 DIAGNOSIS — N3000 Acute cystitis without hematuria: Secondary | ICD-10-CM | POA: Insufficient documentation

## 2021-04-06 DIAGNOSIS — F17290 Nicotine dependence, other tobacco product, uncomplicated: Secondary | ICD-10-CM | POA: Diagnosis not present

## 2021-04-06 DIAGNOSIS — J45909 Unspecified asthma, uncomplicated: Secondary | ICD-10-CM | POA: Insufficient documentation

## 2021-04-06 DIAGNOSIS — R300 Dysuria: Secondary | ICD-10-CM | POA: Diagnosis present

## 2021-04-06 NOTE — ED Triage Notes (Signed)
Dysuria today. Had a UTI last month and completed Macrobid .

## 2021-04-07 ENCOUNTER — Emergency Department (HOSPITAL_BASED_OUTPATIENT_CLINIC_OR_DEPARTMENT_OTHER)
Admission: EM | Admit: 2021-04-07 | Discharge: 2021-04-07 | Disposition: A | Discharge status: Home / Self Care | Payer: Medicaid Other | Attending: Emergency Medicine | Admitting: Emergency Medicine

## 2021-04-07 DIAGNOSIS — N3000 Acute cystitis without hematuria: Secondary | ICD-10-CM

## 2021-04-07 LAB — URINALYSIS, ROUTINE W REFLEX MICROSCOPIC
Bilirubin Urine: NEGATIVE
Glucose, UA: NEGATIVE mg/dL
Ketones, ur: NEGATIVE mg/dL
Nitrite: NEGATIVE
Protein, ur: NEGATIVE mg/dL
Specific Gravity, Urine: 1.009 (ref 1.005–1.030)
WBC, UA: >50 WBC/hpf — ABNORMAL HIGH (ref 0–5)
pH: 6 (ref 5.0–8.0)

## 2021-04-07 LAB — PREGNANCY, URINE: Preg Test, Ur: NEGATIVE

## 2021-04-07 MED ORDER — SULFAMETHOXAZOLE-TRIMETHOPRIM 800-160 MG PO TABS: 1.0000 | ORAL_TABLET | Freq: Two times a day (BID) | ORAL | 0 refills | Status: AC

## 2021-04-07 MED ORDER — SULFAMETHOXAZOLE-TRIMETHOPRIM 800-160 MG PO TABS
1.0000 | ORAL_TABLET | Freq: Once | ORAL | Status: AC
  Administered 2021-04-07: 1 via ORAL
  Filled 2021-04-07: qty 1

## 2021-04-07 MED ORDER — SULFAMETHOXAZOLE-TRIMETHOPRIM 800-160 MG PO TABS: 1.0000 | ORAL_TABLET | Freq: Two times a day (BID) | ORAL | 0 refills | Status: DC

## 2021-04-07 NOTE — ED Provider Notes (Signed)
[facility] EMERGENCY [facility] Emergency Department Provider Note MRN:  [id number]  Arrival date & time: 04/07/21     Chief Complaint   Dysuria   History of Present Illness   Patricia Duran is a 25 y.o. year-old female with no pertinent past medical history presenting to the ED with chief complaint of dysuria.  Discomfort with urination and some increased frequency over the past 1 or 2 days.  No fever.  Symptoms constant, mild, no exacerbating relieving factors.  Review of Systems  A problem-focused ROS was performed. Positive for dysuria.  Patient denies fever.  Patient's Health History    Past Medical History:  Diagnosis Date   Asthma    Back pain    GERD (gastroesophageal reflux disease)    HSV-2 (herpes simplex virus 2) infection    Muscle spasm    Obesity    Thyroid disease     Past Surgical History:  Procedure Laterality Date   TOE SURGERY     WISDOM TOOTH EXTRACTION      Family History  Problem Relation Age of Onset   Asthma Other    Hypertension Other    Hyperlipidemia Other    Sleep apnea Other    Stroke Other    Heart attack Other    Hypertension Mother    Asthma Mother    Sleep apnea Mother    Obesity Mother    Hypertension Father     Social History   Socioeconomic History   Marital status: Single    Spouse name: Not on file   Number of children: Not on file   Years of education: Not on file   Highest education level: Not on file  Occupational History   Occupation: driver  Tobacco Use   Smoking status: Every Day    Types: Cigars   Smokeless tobacco: Never   Tobacco comments:    black and milds  Vaping Use   Vaping Use: Never used  Substance and Sexual Activity   Alcohol use: Yes    Comment: occ   Drug use: Yes    Types: Marijuana    Comment: daily   Sexual activity: Yes    Birth control/protection: Injection  Other Topics Concern   Not on file  Social History Narrative   Not on file   Social Determinants of Health    Financial Resource Strain: Not on file  Food Insecurity: Not on file  Transportation Needs: Not on file  Physical Activity: Not on file  Stress: Not on file  Social Connections: Not on file  Intimate Partner Violence: Not on file     Physical Exam   Vitals:   04/06/21 2159  BP: (!) 146/85  Pulse: (!) 115  Resp: 20  Temp: 98.5 F (36.9 C)  SpO2: 100%    CONSTITUTIONAL: Well-appearing, NAD NEURO:  Alert and oriented x 3, no focal deficits EYES:  eyes equal and reactive ENT/NECK:  no LAD, no JVD CARDIO: Regular rate, well-perfused, normal S1 and S2 PULM:  CTAB no wheezing or rhonchi GI/GU:  normal bowel sounds, non-distended, non-tender MSK/SPINE:  No gross deformities, no edema SKIN:  no rash, atraumatic PSYCH:  Appropriate speech and behavior  *Additional and/or pertinent findings included in MDM below  Diagnostic and Interventional Summary    EKG Interpretation  Date/Time:    Ventricular Rate:    PR Interval:    QRS Duration:   QT Interval:    QTC Calculation:   R Axis:  Text Interpretation:         Labs Reviewed  URINALYSIS, ROUTINE W REFLEX MICROSCOPIC - Abnormal; Notable for the following components:      Result Value   Hgb urine dipstick LARGE (*)    Leukocytes,Ua LARGE (*)    WBC, UA >50 (*)    All other components within normal limits  PREGNANCY, URINE    No orders to display    Medications  sulfamethoxazole-trimethoprim (BACTRIM DS) 800-160 MG per tablet 1 tablet (has no administration in time range)     Procedures  /  Critical Care Procedures  ED Course and Medical Decision Making  I have reviewed the triage vital signs, the nursing notes, and pertinent available records from the EMR.  Listed above are laboratory and imaging tests that I personally ordered, reviewed, and interpreted and then considered in my medical decision making (see below for details).  Symptoms consistent with UTI, confirmed by urinalysis.  No flank pain, no  abdominal pain, no systemic symptoms, on my evaluation heart rate improved to the 80s.  Appropriate for discharge.       Elmer Sow. Pilar Plate, MD [facility] Emergency Medicine [facility] [email or web address]  Final Clinical Impressions(s) / ED Diagnoses     ICD-10-CM   1. Acute cystitis without hematuria  N30.00       ED Discharge Orders          Ordered    sulfamethoxazole-trimethoprim (BACTRIM DS) 800-160 MG tablet  2 times daily,   Status:  Discontinued        04/07/21 0130    sulfamethoxazole-trimethoprim (BACTRIM DS) 800-160 MG tablet  2 times daily        04/07/21 0130             Discharge Instructions Discussed with and Provided to Patient:    Discharge Instructions      You were evaluated in the Emergency Department and after careful evaluation, we did not find any emergent condition requiring admission or further testing in the hospital.  Your exam/testing today was overall reassuring.  Urine sample showing evidence of a UTI.  Please take the Bactrim antibiotic as prescribed.  Please return to the Emergency Department if you experience any worsening of your condition.  Thank you for allowing [place] to be a part of your care.        Sabas Sous, MD 04/07/21 [phone]

## 2021-04-07 NOTE — Discharge Instructions (Addendum)
You were evaluated in the Emergency Department and after careful evaluation, we did not find any emergent condition requiring admission or further testing in the hospital.  Your exam/testing today was overall reassuring.  Urine sample showing evidence of a UTI.  Please take the Bactrim antibiotic as prescribed.  Please return to the Emergency Department if you experience any worsening of your condition.  Thank you for allowing [place] to be a part of your care.

## 2021-04-08 ENCOUNTER — Telehealth: Payer: Self-pay

## 2021-04-08 NOTE — Telephone Encounter (Signed)
Transition Care Management Unsuccessful Follow-up Telephone Call  Date of discharge and from where:  04/07/2021-[facility]  Attempts:  1st Attempt  Reason for unsuccessful TCM follow-up call:  Left voice message

## 2021-04-09 NOTE — Telephone Encounter (Signed)
Transition Care Management Unsuccessful Follow-up Telephone Call  Date of discharge and from where:  04/07/2021-[facility]  Attempts:  2nd Attempt  Reason for unsuccessful TCM follow-up call:  Left voice message

## 2021-04-10 NOTE — Telephone Encounter (Signed)
Transition Care Management Unsuccessful Follow-up Telephone Call  Date of discharge and from where:  04/07/2021-[facility]   Attempts:  3rd Attempt  Reason for unsuccessful TCM follow-up call:  Left voice message

## 2021-04-15 ENCOUNTER — Ambulatory Visit (INDEPENDENT_AMBULATORY_CARE_PROVIDER_SITE_OTHER): Payer: Medicaid Other

## 2021-04-15 ENCOUNTER — Other Ambulatory Visit: Payer: Self-pay

## 2021-04-15 DIAGNOSIS — Z3042 Encounter for surveillance of injectable contraceptive: Secondary | ICD-10-CM | POA: Diagnosis not present

## 2021-04-15 NOTE — Progress Notes (Addendum)
Subjective:  Pt in for Depo Provera injection.    Objective: Need for contraception. No unusual complaints.    Assessment: Pt's supply Depo given LD - pt's preferred location. Pt tolerated Depo injection.   Plan:  Next injection scheduled 07/13/21.  Administrations This Visit     medroxyPROGESTERone (DEPO-PROVERA) injection 150 mg     Admin Date 04/15/2021 Action Given Dose 150 mg Route Intramuscular Administered By Lewayne Bunting, CMA

## 2021-04-28 ENCOUNTER — Telehealth: Payer: Self-pay | Admitting: Obstetrics

## 2021-04-28 DIAGNOSIS — A6004 Herpesviral vulvovaginitis: Secondary | ICD-10-CM

## 2021-04-28 MED ORDER — PHENAZOPYRIDINE HCL 200 MG PO TABS: 200.0000 mg | ORAL_TABLET | Freq: Three times a day (TID) | ORAL | 0 refills | Status: DC | PRN

## 2021-04-28 MED ORDER — NITROFURANTOIN MONOHYD MACRO 100 MG PO CAPS: 100.0000 mg | ORAL_CAPSULE | Freq: Two times a day (BID) | ORAL | 0 refills | Status: AC

## 2021-04-28 MED ORDER — VALACYCLOVIR HCL 1 G PO TABS: 1000.0000 mg | ORAL_TABLET | Freq: Two times a day (BID) | ORAL | 11 refills | Status: DC

## 2021-04-28 NOTE — Telephone Encounter (Signed)
Patient called, she is having urinary frequency.  She leave tomorrow for vacation and is not able to come in to be seen or leave a urine sample.   Macrobid and Pyridium sent to pharmacy per prototocol.   Patient also requested a refill in her Valtrex.   Notified patient that is symptoms persist, she would need to make and appointment to come and be seen.

## 2021-07-06 ENCOUNTER — Other Ambulatory Visit: Payer: Self-pay

## 2021-07-06 ENCOUNTER — Ambulatory Visit (INDEPENDENT_AMBULATORY_CARE_PROVIDER_SITE_OTHER): Payer: Medicaid Other

## 2021-07-06 DIAGNOSIS — Z3042 Encounter for surveillance of injectable contraceptive: Secondary | ICD-10-CM

## 2021-07-06 MED ORDER — MEDROXYPROGESTERONE ACETATE 150 MG/ML IM SUSP
150.0000 mg | Freq: Once | INTRAMUSCULAR | Status: AC
  Administered 2021-07-06: 150 mg via INTRAMUSCULAR

## 2021-07-06 NOTE — Progress Notes (Signed)
Patient presents for depo injection. Patient is within her window. Inj given in RD. Patient tolerated well. Next depo due 1/30 - 2/13.

## 2021-07-06 NOTE — Progress Notes (Signed)
Patient was assessed and managed by nursing staff during this encounter. I have reviewed the chart and agree with the documentation and plan. I have also made any necessary editorial changes.  Warden Fillers, MD 07/06/2021 1:03 PM

## 2021-07-13 ENCOUNTER — Ambulatory Visit: Payer: Medicaid Other

## 2021-08-10 DIAGNOSIS — M79671 Pain in right foot: Secondary | ICD-10-CM | POA: Diagnosis not present

## 2021-08-10 DIAGNOSIS — M79672 Pain in left foot: Secondary | ICD-10-CM | POA: Diagnosis not present

## 2021-10-01 ENCOUNTER — Ambulatory Visit (INDEPENDENT_AMBULATORY_CARE_PROVIDER_SITE_OTHER): Payer: Medicaid Other | Admitting: *Deleted

## 2021-10-01 ENCOUNTER — Other Ambulatory Visit: Payer: Self-pay

## 2021-10-01 DIAGNOSIS — Z3042 Encounter for surveillance of injectable contraceptive: Secondary | ICD-10-CM

## 2021-10-01 MED ORDER — MEDROXYPROGESTERONE ACETATE 150 MG/ML IM SUSP
150.0000 mg | Freq: Once | INTRAMUSCULAR | Status: AC
  Administered 2021-10-01: 150 mg via INTRAMUSCULAR

## 2021-10-01 NOTE — Progress Notes (Signed)
Date last pap: 09/04/20/ colpo 11/21/20. Last Depo-Provera: 07/06/21. Side Effects if any: NA. Serum HCG indicated? NA. Depo-Provera 150 mg IM given by: Montez Morita, RNC left deltoid. Next appointment due 12/17/21-12/31/21.

## 2021-12-17 ENCOUNTER — Encounter: Payer: Self-pay | Admitting: Obstetrics

## 2021-12-17 ENCOUNTER — Ambulatory Visit (INDEPENDENT_AMBULATORY_CARE_PROVIDER_SITE_OTHER): Payer: Medicaid Other | Admitting: Obstetrics

## 2021-12-17 ENCOUNTER — Ambulatory Visit: Payer: Medicaid Other

## 2021-12-17 ENCOUNTER — Other Ambulatory Visit (HOSPITAL_COMMUNITY)
Admission: RE | Admit: 2021-12-17 | Discharge: 2021-12-17 | Disposition: A | Discharge status: Home / Self Care | Payer: Medicaid Other | Source: Ambulatory Visit | Attending: Obstetrics | Admitting: Obstetrics

## 2021-12-17 VITALS — BP 138/96 | HR 98 | Ht 69.0 in | Wt >= 6400 oz

## 2021-12-17 DIAGNOSIS — N946 Dysmenorrhea, unspecified: Secondary | ICD-10-CM

## 2021-12-17 DIAGNOSIS — F320 Major depressive disorder, single episode, mild: Secondary | ICD-10-CM

## 2021-12-17 DIAGNOSIS — Z01419 Encounter for gynecological examination (general) (routine) without abnormal findings: Secondary | ICD-10-CM

## 2021-12-17 DIAGNOSIS — Z113 Encounter for screening for infections with a predominantly sexual mode of transmission: Secondary | ICD-10-CM | POA: Diagnosis not present

## 2021-12-17 DIAGNOSIS — E559 Vitamin D deficiency, unspecified: Secondary | ICD-10-CM | POA: Diagnosis not present

## 2021-12-17 DIAGNOSIS — Z3042 Encounter for surveillance of injectable contraceptive: Secondary | ICD-10-CM | POA: Diagnosis not present

## 2021-12-17 DIAGNOSIS — N898 Other specified noninflammatory disorders of vagina: Secondary | ICD-10-CM | POA: Insufficient documentation

## 2021-12-17 DIAGNOSIS — Z6841 Body Mass Index (BMI) 40.0 and over, adult: Secondary | ICD-10-CM

## 2021-12-17 MED ORDER — VITAMIN D 50 MCG (2000 UT) PO CAPS: 1.0000 | ORAL_CAPSULE | Freq: Every day | ORAL | 11 refills | Status: DC

## 2021-12-17 MED ORDER — MEDROXYPROGESTERONE ACETATE 150 MG/ML IM SUSP
150.0000 mg | Freq: Once | INTRAMUSCULAR | Status: AC
  Administered 2021-12-17: 150 mg via INTRAMUSCULAR

## 2021-12-17 MED ORDER — PNV-DHA+DOCUSATE 27-1.25-300 MG PO CAPS: 1.0000 | ORAL_CAPSULE | Freq: Every day | ORAL | 4 refills | Status: AC

## 2021-12-17 MED ORDER — MEDROXYPROGESTERONE ACETATE 150 MG/ML IM SUSP: INTRAMUSCULAR | 4 refills | Status: DC

## 2021-12-17 NOTE — Addendum Note (Signed)
Addended by: Coral Ceo A on: 12/17/2021 04:49 PM ? ? Modules accepted: Orders ? ?

## 2021-12-17 NOTE — Addendum Note (Signed)
Addended by: Baltazar Najjar A on: 12/17/2021 04:26 PM ? ? Modules accepted: Orders ? ?

## 2021-12-17 NOTE — Progress Notes (Addendum)
? ?Subjective: ? ? ?  ?  ? Patricia VORA is a 26 y.o. female here for a routine exam.  Current complaints: Vaginal discharge.   ? ?Personal health questionnaire:  ?Is patient Ashkenazi Jewish, have a family history of breast and/or ovarian cancer: no ?Is there a family history of uterine cancer diagnosed at age < 65, gastrointestinal cancer, urinary tract cancer, family member who is a Personnel officer syndrome-associated carrier: no ?Is the patient overweight and hypertensive, family history of diabetes, personal history of gestational diabetes, preeclampsia or PCOS: yes ?Is patient over 57, have PCOS,  family history of premature CHD under age 58, diabetes, smoke, have hypertension or peripheral artery disease:  no ?At any time, has a partner hit, kicked or otherwise hurt or frightened you?: no ?Over the past 2 weeks, have you felt down, depressed or hopeless?: no ?Over the past 2 weeks, have you felt little interest or pleasure in doing things?:no ? ? ?Gynecologic History ?No LMP recorded. Patient has had an injection. ?Contraception: Depo-Provera injections ?Last Pap: 09-04-2020. Results were: LGSIL.  Colposcopic Biopsies:  CIN 1 ?Last mammogram: n/a. Results were: n/a ? ?Obstetric History ?OB History  ?Gravida Para Term Preterm AB Living  ?0 0 0 0 0 0  ?SAB IAB Ectopic Multiple Live Births  ?0 0 0 0 0  ? ? ?Past Medical History:  ?Diagnosis Date  ? Asthma   ? Back pain   ? GERD (gastroesophageal reflux disease)   ? HSV-2 (herpes simplex virus 2) infection   ? Muscle spasm   ? Obesity   ? Thyroid disease   ?  ?Past Surgical History:  ?Procedure Laterality Date  ? TOE SURGERY    ? WISDOM TOOTH EXTRACTION    ?  ? ?Current Outpatient Medications:  ?  valACYclovir (VALTREX) 1000 MG tablet, Take 1 tablet (1,000 mg total) by mouth 2 (two) times daily., Disp: 30 tablet, Rfl: 11 ?  buPROPion (WELLBUTRIN XL) 150 MG 24 hr tablet, Take 1 tablet (150 mg total) by mouth daily. (Patient not taking: Reported on 12/17/2021), Disp:  90 tablet, Rfl: 0 ?  medroxyPROGESTERone (DEPO-PROVERA) 150 MG/ML injection, INJECT IN THE MUSCLE EVERY 3 MONTHS, Disp: 1 mL, Rfl: 4 ?  meloxicam (MOBIC) 15 MG tablet, Take 15 mg by mouth daily. (Patient not taking: Reported on 12/17/2021), Disp: , Rfl:  ?  phenazopyridine (PYRIDIUM) 200 MG tablet, Take 1 tablet (200 mg total) by mouth 3 (three) times daily as needed for pain. (Patient not taking: Reported on 12/17/2021), Disp: 10 tablet, Rfl: 0 ?  tiZANidine (ZANAFLEX) 4 MG tablet, Take 4 mg by mouth every 6 (six) hours as needed. (Patient not taking: Reported on 12/17/2021), Disp: , Rfl:  ?  Vitamin D, Ergocalciferol, (DRISDOL) 1.25 MG (50000 UNIT) CAPS capsule, Take 1 capsule (50,000 Units total) by mouth every 7 (seven) days. (Patient not taking: Reported on 12/17/2021), Disp: 4 capsule, Rfl: 0 ? ?Current Facility-Administered Medications:  ?  medroxyPROGESTERone (DEPO-PROVERA) injection 150 mg, 150 mg, Intramuscular, Q90 days, Conan Bowens, MD, 150 mg at 04/15/21 1030 ?  medroxyPROGESTERone (DEPO-PROVERA) injection 150 mg, 150 mg, Intramuscular, Once, Brock Bad, MD ?No Known Allergies  ?Social History  ? ?Tobacco Use  ? Smoking status: Every Day  ?  Types: Cigars  ? Smokeless tobacco: Never  ? Tobacco comments:  ?  black and milds  ?Substance Use Topics  ? Alcohol use: Yes  ?  Comment: occ  ?  ?Family History  ?Problem Relation  Age of Onset  ? Hypertension Mother   ? Asthma Mother   ? Sleep apnea Mother   ? Obesity Mother   ? Hypertension Father   ? Cancer Maternal Grandmother   ? Anxiety disorder Maternal Grandmother   ? Asthma Other   ? Hypertension Other   ? Hyperlipidemia Other   ? Sleep apnea Other   ? Stroke Other   ? Heart attack Other   ?  ? ? ?Review of Systems ? ?Constitutional: negative for fatigue and weight loss ?Respiratory: negative for cough and wheezing ?Cardiovascular: negative for chest pain, fatigue and palpitations ?Gastrointestinal: negative for abdominal pain and change in bowel  habits ?Musculoskeletal:negative for myalgias ?Neurological: negative for gait problems and tremors ?Behavioral/Psych:  positive for depression.  negative for abusive relationship ?Endocrine: negative for temperature intolerance    ?Genitourinary: positive for vaginal discharge.  negative for abnormal menstrual periods, genital lesions, hot flashes, sexual problems  ?Integument/breast: negative for breast lump, breast tenderness, nipple discharge and skin lesion(s) ? ?  ?Objective:  ? ?    ?BP (!) 138/96   Pulse 98   Ht  (1.753 m)   Wt (!) 417 lb 1.6 oz (189.2 kg)   BMI 61.59 kg/m?  ?General:   Alert and no distress  ?Skin:   no rash or abnormalities  ?Lungs:   clear to auscultation bilaterally  ?Heart:   regular rate and rhythm, S1, S2 normal, no murmur, click, rub or gallop  ?Breasts:   normal without suspicious masses, skin or nipple changes or axillary nodes  ?Abdomen:  normal findings: no organomegaly, soft, non-tender and no hernia  ?Pelvis:  External genitalia: normal general appearance ?Urinary system: urethral meatus normal and bladder without fullness, nontender ?Vaginal: normal without tenderness, induration or masses ?Cervix: normal appearance ?Adnexa: normal bimanual exam ?Uterus: anteverted and non-tender, normal size  ? ?Lab Review ?Urine pregnancy test ?Labs reviewed no ?Radiologic studies reviewed no ? ?I have spent a total of 20 minutes of face-to-face time, excluding clinical staff time, reviewing notes and preparing to see patient, ordering tests and/or medications, and counseling the patient.  ? ?Assessment:  ? ? 1. Encounter for gynecological examination with Papanicolaou smear of cervix ?Rx: ?- Cytology - PAP( [place]) ? ?2. Dysmenorrhea ?Rx: ?- medroxyPROGESTERone (DEPO-PROVERA) 150 MG/ML injection; INJECT IN THE MUSCLE EVERY 3 MONTHS  Dispense: 1 mL; Refill: 4 ? ?3. Vaginal discharge ?Rx: ?- Cervicovaginal ancillary only( [place]) ? ?4. Screening for STD (sexually  transmitted disease) ?Rx: ?- Hepatitis C antibody ?- HIV Antibody (routine testing w rflx) ?- RPR ?- Hepatitis B Surface AntiGEN ? ?5. Encounter for surveillance of injectable contraceptive ?Rx: ?- medroxyPROGESTERone (DEPO-PROVERA) injection 150 mg ? ?6. Class 3 severe obesity without serious comorbidity with body mass index (BMI) of 60.0 to 69.9 in adult, unspecified obesity type (HCC) ?- weight reduction with the aid of dietary changes, exercise and behavioral modification recommended ? ?7. Depression ?- Ambulatory referral to Integrated Behavioral; Health ? ? ?  ?Plan:  ? ? Education reviewed: calcium supplements, depression evaluation, low fat, low cholesterol diet, safe sex/STD prevention, self breast exams, and weight bearing exercise. ?Contraception: Depo-Provera injections. ?Follow up in: 1 year.  ? ?Meds ordered this encounter  ?Medications  ? medroxyPROGESTERone (DEPO-PROVERA) injection 150 mg  ? medroxyPROGESTERone (DEPO-PROVERA) 150 MG/ML injection  ?  Sig: INJECT IN THE MUSCLE EVERY 3 MONTHS  ?  Dispense:  1 mL  ?  Refill:  4  ? ?Orders Placed  This Encounter  ?Procedures  ? Hepatitis C antibody  ? HIV Antibody (routine testing w rflx)  ? RPR  ? Hepatitis B Surface AntiGEN  ? Ambulatory referral to Integrated Behavioral Health  ?  Referral Priority:   Routine  ?  Referral Type:   Consultation  ?  Referral Reason:   Specialty Services Required  ?  Number of Visits Requested:   1  ? ? ? ? Brock Bad, MD ?12/17/2021 3:35 PM  ?

## 2021-12-17 NOTE — Progress Notes (Signed)
Pt in office for annual exam and depo injection. Pt requesting STD testing. Pt does not have any other concerns at this time.  ?

## 2021-12-17 NOTE — Addendum Note (Signed)
Addended by: Maryruth Eve on: 12/17/2021 04:41 PM ? ? Modules accepted: Orders ? ?

## 2021-12-17 NOTE — Addendum Note (Signed)
Addended by: Jearld Adjutant on: 12/17/2021 04:26 PM ? ? Modules accepted: Orders ? ?

## 2021-12-18 ENCOUNTER — Telehealth: Payer: Self-pay

## 2021-12-18 LAB — HEPATITIS C ANTIBODY: Hep C Virus Ab: NONREACTIVE

## 2021-12-18 LAB — CERVICOVAGINAL ANCILLARY ONLY
Bacterial Vaginitis (gardnerella): POSITIVE — AB
Candida Glabrata: NEGATIVE
Candida Vaginitis: NEGATIVE
Chlamydia: NEGATIVE
Comment: NEGATIVE
Comment: NEGATIVE
Comment: NEGATIVE
Comment: NEGATIVE
Comment: NEGATIVE
Comment: NORMAL
Neisseria Gonorrhea: NEGATIVE
Trichomonas: NEGATIVE

## 2021-12-18 LAB — RPR: RPR Ser Ql: NONREACTIVE

## 2021-12-18 LAB — VITAMIN D 25 HYDROXY (VIT D DEFICIENCY, FRACTURES): Vit D, 25-Hydroxy: 17.9 ng/mL — ABNORMAL LOW (ref 30.0–100.0)

## 2021-12-18 LAB — HEPATITIS B SURFACE ANTIGEN: Hepatitis B Surface Ag: NEGATIVE

## 2021-12-18 LAB — HIV ANTIBODY (ROUTINE TESTING W REFLEX): HIV Screen 4th Generation wRfx: NONREACTIVE

## 2021-12-18 NOTE — Telephone Encounter (Signed)
S/w pt and advised of results and rx 

## 2021-12-21 ENCOUNTER — Other Ambulatory Visit: Payer: Self-pay | Admitting: Obstetrics

## 2021-12-21 DIAGNOSIS — B9689 Other specified bacterial agents as the cause of diseases classified elsewhere: Secondary | ICD-10-CM

## 2021-12-21 MED ORDER — METRONIDAZOLE 500 MG PO TABS: 500.0000 mg | ORAL_TABLET | Freq: Two times a day (BID) | ORAL | 2 refills | Status: DC

## 2021-12-24 LAB — CYTOLOGY - PAP
Diagnosis: NEGATIVE
Diagnosis: REACTIVE

## 2021-12-26 ENCOUNTER — Other Ambulatory Visit: Payer: Self-pay | Admitting: Obstetrics

## 2022-02-03 ENCOUNTER — Encounter (HOSPITAL_COMMUNITY): Payer: Self-pay | Admitting: Emergency Medicine

## 2022-02-03 ENCOUNTER — Ambulatory Visit (HOSPITAL_COMMUNITY)
Admission: EM | Admit: 2022-02-03 | Discharge: 2022-02-03 | Disposition: A | Discharge status: Home / Self Care | Payer: Medicaid Other | Attending: Internal Medicine | Admitting: Internal Medicine

## 2022-02-03 DIAGNOSIS — M25552 Pain in left hip: Secondary | ICD-10-CM | POA: Diagnosis not present

## 2022-02-03 MED ORDER — NAPROXEN 500 MG PO TABS: 500.0000 mg | ORAL_TABLET | Freq: Two times a day (BID) | ORAL | 0 refills | Status: DC

## 2022-02-03 MED ORDER — METHOCARBAMOL 500 MG PO TABS: 500.0000 mg | ORAL_TABLET | Freq: Two times a day (BID) | ORAL | 0 refills | Status: DC

## 2022-02-03 NOTE — ED Triage Notes (Signed)
Pt is present today with left hip pain. Pt states the pain started one week ago. Denies any injury

## 2022-02-03 NOTE — Discharge Instructions (Addendum)
Your pain is likely musculoskeletal in nature. Take naproxen 500 twice daily for the next 2 to 3 days.  Do not take any other NSAID containing medications while you are taking this medicine. Take Robaxin muscle relaxer as needed for muscle spasm.  Apply heat to your left hip for further pain relief.  Perform gentle stretches and range of motion exercises to prevent muscle stiffness.  Do not drink alcohol or drive while taking this medication as it can make you sleepy.

## 2022-02-03 NOTE — ED Provider Notes (Signed)
[facility]    CSN: [id number] Arrival date & time: 02/03/22  1816      History   Chief Complaint Chief Complaint  Patient presents with   Hip Pain    HPI Patricia Duran is a 26 y.o. female.   Patient presents urgent care for evaluation of her left hip pain for the last week.  She denies injury or trauma to the area.  No ecchymosis or swelling to the area.  Pain is worsened by walking and she states that the pain improves when she sits down intermittently.  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  Pain to her left hip is currently a 7 on a scale of 0-10. She has taken NSAIDS intermittently for pain with relief. Last dose of NSAID was yesterday. She has seen sports medicine at [facility] for various aches and pains in the past and states she has a past history of arthritis to her spine. She states she has been attempting to lose weight, but is having a difficult time doing so due to life stressors and work. Pain is worse after sitting for long periods of time and movement. No other aggravating or relieving factors identified for symptoms at this time.   Hip Pain    Past Medical History:  Diagnosis Date   Asthma    Back pain    GERD (gastroesophageal reflux disease)    HSV-2 (herpes simplex virus 2) infection    Muscle spasm    Obesity    Thyroid disease     Patient Active Problem List   Diagnosis Date Noted   Herpes simplex vulvovaginitis 10/30/2020   Vitamin D deficiency 10/02/2020   Depression 10/02/2020   Class 3 severe obesity with serious comorbidity and body mass index (BMI) of 50.0 to 59.9 in adult [facility]) 10/02/2020   Dysmenorrhea 05/27/2016   Abnormal echocardiogram findings without diagnosis 03/23/2013    Past Surgical History:  Procedure Laterality Date   TOE SURGERY     WISDOM TOOTH EXTRACTION      OB History     Gravida  0   Para  0   Term  0   Preterm  0   AB  0    Living  0      SAB  0   IAB  0   Ectopic  0   Multiple  0   Live Births  0            Home Medications    Prior to Admission medications   Medication Sig Start Date End Date Taking? Authorizing Provider  methocarbamol (ROBAXIN) 500 MG tablet Take 1 tablet (500 mg total) by mouth 2 (two) times daily. 02/03/22  Yes Talbot Grumbling, FNP  naproxen (NAPROSYN) 500 MG tablet Take 1 tablet (500 mg total) by mouth 2 (two) times daily. 02/03/22  Yes Talbot Grumbling, FNP  buPROPion (WELLBUTRIN XL) 150 MG 24 hr tablet Take 1 tablet (150 mg total) by mouth daily. Patient not taking: Reported on 12/17/2021 10/16/20   Briscoe Deutscher, DO  Cholecalciferol (VITAMIN D) 50 MCG (2000 UT) CAPS Take 1 capsule (2,000 Units total) by mouth daily before breakfast. 12/17/21   Shelly Bombard, MD  medroxyPROGESTERone (DEPO-PROVERA) 150 MG/ML injection INJECT 1ML IN THE MUSCLE EVERY 3 MONTHS 12/17/21   Shelly Bombard, MD  meloxicam (MOBIC) 15 MG tablet Take 15 mg by mouth daily.  Patient not taking: Reported on 12/17/2021    [provider]  metroNIDAZOLE (FLAGYL) 500 MG tablet Take 1 tablet (500 mg total) by mouth 2 (two) times daily. 12/21/21   Shelly Bombard, MD  phenazopyridine (PYRIDIUM) 200 MG tablet Take 1 tablet (200 mg total) by mouth 3 (three) times daily as needed for pain. Patient not taking: Reported on 12/17/2021 04/28/21   Shelly Bombard, MD  Prenat-FeFum-DSS-FA-DHA w/o A (PNV-DHA+DOCUSATE) 27-1.25-300 MG CAPS Take 1 capsule by mouth daily before breakfast. 12/17/21   Shelly Bombard, MD  tiZANidine (ZANAFLEX) 4 MG tablet Take 4 mg by mouth every 6 (six) hours as needed. Patient not taking: Reported on 12/17/2021 02/14/21   [provider]  valACYclovir (VALTREX) 1000 MG tablet Take 1 tablet (1,000 mg total) by mouth 2 (two) times daily. 04/28/21   Shelly Bombard, MD  Norethin Ace-Eth Estrad-FE [place]) 1-20 MG-MCG(24) CAPS Take 1 capsule by mouth daily before  breakfast. 07/11/19 08/09/19  Shelly Bombard, MD    Family History Family History  Problem Relation Age of Onset   Hypertension Mother    Asthma Mother    Sleep apnea Mother    Obesity Mother    Hypertension Father    Cancer Maternal Grandmother    Anxiety disorder Maternal Grandmother    Asthma Other    Hypertension Other    Hyperlipidemia Other    Sleep apnea Other    Stroke Other    Heart attack Other     Social History Social History   Tobacco Use   Smoking status: Every Day    Types: Cigars   Smokeless tobacco: Never   Tobacco comments:    black and milds  Vaping Use   Vaping Use: Never used  Substance Use Topics   Alcohol use: Yes    Comment: occ   Drug use: Yes    Types: Marijuana    Comment: daily     Allergies   Patient has no known allergies.   Review of Systems Review of Systems Per HPI  Physical Exam Triage Vital Signs ED Triage Vitals  Enc Vitals Group     BP 02/03/22 1856 135/87     Pulse Rate 02/03/22 1856 96     Resp 02/03/22 1856 18     Temp 02/03/22 1856 98 F (36.7 C)     Temp src --      SpO2 02/03/22 1856 97 %     Weight --      Height --      Head Circumference --      Peak Flow --      Pain Score 02/03/22 1855 8     Pain Loc --      Pain Edu? --      Excl. in [place]? --    No data found.  Updated Vital Signs BP 135/87   Pulse 96   Temp 98 F (36.7 C)   Resp 18   SpO2 97%   Visual Acuity Right Eye Distance:   Left Eye Distance:   Bilateral Distance:    Right Eye Near:   Left Eye Near:    Bilateral Near:     Physical Exam Vitals and nursing note reviewed.  Constitutional:      Appearance: Normal appearance. She is not ill-appearing or toxic-appearing.     Comments: Very pleasant patient sitting on exam in position of comfort table in no acute distress.   HENT:  Head: Normocephalic and atraumatic.     Right Ear: Hearing and external ear normal.     Left Ear: Hearing and external ear normal.      Nose: Nose normal.     Mouth/Throat:     Lips: Pink.     Mouth: Mucous membranes are moist.  Eyes:     General: Lids are normal. Vision grossly intact. Gaze aligned appropriately.     Extraocular Movements: Extraocular movements intact.     Conjunctiva/sclera: Conjunctivae normal.  Pulmonary:     Effort: Pulmonary effort is normal.  Abdominal:     Palpations: Abdomen is soft.  Musculoskeletal:     Cervical back: Normal and neck supple.     Thoracic back: Normal.     Lumbar back: Normal.     Right hip: Normal.     Left hip: Tenderness present. No deformity, lacerations or bony tenderness. Normal range of motion. Normal strength.     Comments: 5/5 strength against resistance with abduction and adduction of bilateral lower extremities. 5/5 grip strength to bilateral upper extremities. She is neurovascularly intact distal to area of greatest tenderness. She is non-tender to palpation of the left hip and there is no tenderness to palpation of the sciatic joints bilaterally. No radiculopathy. Range of motion with lateral flexion at the hips elicits small amount of tenderness to the left hip area. Walks without antalgic gait.   Skin:    General: Skin is warm and dry.     Capillary Refill: Capillary refill takes less than 2 seconds.     Findings: No rash.  Neurological:     General: No focal deficit present.     Mental Status: She is alert and oriented to person, place, and time. Mental status is at baseline.     Cranial Nerves: No dysarthria or facial asymmetry.     Gait: Gait is intact.  Psychiatric:        Mood and Affect: Mood normal.        Speech: Speech normal.        Behavior: Behavior normal.        Thought Content: Thought content normal.        Judgment: Judgment normal.      UC Treatments / Results  Labs (all labs ordered are listed, but only abnormal results are displayed) Labs Reviewed - No data to display  EKG   Radiology No results  found.  Procedures Procedures (including critical care time)  Medications Ordered in UC Medications - No data to display  Initial Impression / Assessment and Plan / UC Course  I have reviewed the triage vital signs and the nursing notes.  Pertinent labs & imaging results that were available during my care of the patient were reviewed by me and considered in my medical decision making (see chart for details).   Left Hip Pain Pain is consistent with muscle strain and muscle tightness. No clinical indication for imaging at this time as patient has full range of motion that is not limited by tenderness. Plan to treat with naproxen twice daily for the next 2-3 days for acute inflammation and pain, then as needed. She may take methocarbamol as needed at nighttime for muscle spasm. No drinking alcohol or driving while taking muscle relaxer as this can make her drowsy. She is to apply heat and perform gentle stretches to avoid muscle stiffness. Follow-up with sports medicine if pain does not improve.   Counseled patient regarding appropriate use of medications and  potential side effects for all medications recommended or prescribed today. Discussed red flag signs and symptoms of worsening condition,when to call the PCP office, return to urgent care, and when to seek higher level of care. Patient verbalizes understanding and agreement with plan. All questions answered. Patient discharged in stable condition.  Final Clinical Impressions(s) / UC Diagnoses   Final diagnoses:  Left hip pain     Discharge Instructions      Your pain is likely musculoskeletal in nature. Take naproxen 500 twice daily for the next 2 to 3 days.  Do not take any other NSAID containing medications while you are taking this medicine. Take Robaxin muscle relaxer as needed for muscle spasm.  Apply heat to your left hip for further pain relief.  Perform gentle stretches and range of motion exercises to prevent muscle  stiffness.  Do not drink alcohol or drive while taking this medication as it can make you sleepy.      ED Prescriptions     Medication Sig Dispense Auth. Provider   naproxen (NAPROSYN) 500 MG tablet Take 1 tablet (500 mg total) by mouth 2 (two) times daily. 30 tablet Talbot Grumbling, FNP   methocarbamol (ROBAXIN) 500 MG tablet Take 1 tablet (500 mg total) by mouth 2 (two) times daily. 20 tablet Talbot Grumbling, FNP      PDMP not reviewed this encounter.   Talbot Grumbling, [place] 02/06/22 1022

## 2022-03-04 ENCOUNTER — Other Ambulatory Visit: Payer: Medicaid Other

## 2022-03-04 ENCOUNTER — Ambulatory Visit (INDEPENDENT_AMBULATORY_CARE_PROVIDER_SITE_OTHER): Payer: Medicaid Other

## 2022-03-04 VITALS — BP 143/88 | HR 87 | Ht 69.0 in | Wt >= 6400 oz

## 2022-03-04 DIAGNOSIS — E559 Vitamin D deficiency, unspecified: Secondary | ICD-10-CM

## 2022-03-04 DIAGNOSIS — Z3042 Encounter for surveillance of injectable contraceptive: Secondary | ICD-10-CM

## 2022-03-04 MED ORDER — MEDROXYPROGESTERONE ACETATE 150 MG/ML IM SUSP
150.0000 mg | Freq: Once | INTRAMUSCULAR | Status: AC
  Administered 2022-03-04: 150 mg via INTRAMUSCULAR

## 2022-03-04 NOTE — Progress Notes (Signed)
SUBJECTIVE: Patricia Duran is a 26 y.o. female who complains presents for DEPO Injection BC.  OBJECTIVE: Appears well, in no apparent distress.  Vital signs are normal. Urine dipstick shows not done.    ASSESSMENT: Need for BC.  Last PAP 12/17/2021   PLAN: DEPO Injection given in RD, tolerated well.  Next DEPO due Sept. 28 - Oct. 12, 2023.    Administrations This Visit     medroxyPROGESTERone (DEPO-PROVERA) injection 150 mg     Admin Date 03/04/2022 Action Given Dose 150 mg Route Intramuscular Administered By Maretta Bees, RMA

## 2022-03-04 NOTE — Progress Notes (Signed)
Patient was assessed and managed by nursing staff during this encounter. I have reviewed the chart and agree with the documentation and plan. I have also made any necessary editorial changes.  Warden Fillers, MD 03/04/2022 2:03 PM

## 2022-03-04 NOTE — Patient Instructions (Signed)
AREA FAMILY PRACTICE PHYSICIANS  [place] ([address]) [facility] [address] [place], [place] [address] [phone] Mon-Fri 8:30-12:30, 1:30-5:00 Accepting Medicaid [facility] [address], [place], [place] [address] [phone] Mon-Fri 8:00-5:30 [facility] [address] [place], [place] [address] [phone] Mon, Tue, Thur, Fri 8:30-5:00, Wed 10:00-7:00 (closed 1-2pm) Accepting Medicaid [facility] [address], [place], [place]  [address] Phone - [phone]   Fax - [phone]  [place] ([address]) [facility] [address] [place], [place] [address] [phone] Mon-Fri 8:00-5:00 [facility] ([facility])  [address] [place], [place] [address] [phone] Mon-Fri 8:30-5:30, Sat (Oct.-Mar.) 9:00-1:00 Accepting Medicaid  [place] ([address]) [facility] [address], [place], [place] [address] [phone] Mon-Fri 8:00-5:00  [place] [employer] [place] ([address]) [facility] [address], [place], [place] [address] [phone] Mon-Fri 8:00-5:00 [facility] [address], [place], [place] [address] [phone] Mon-Fri 8:00-5:00 [facility] [address] [place], [place] [address] [phone] Mon-Fri 8:00-5:00 [facility] [address] [place] [address] [phone] Mon-Fri 7:30-5:30  [place] ([address] & [address]) [facility] [address] [place], [place] [address] [phone] Mon-Thur 8:00-6:00 Accepting Medicaid [facility] [address] [place], [place] [address] [phone] Mon-Thur 7:30-7:30, Fri 7:30-4:30 Accepting  Medicaid [facility] [address], [place], [place]  [address] [phone]   Fax - [phone]  [place] ([address] & [address]) [facility] [address] [place], [place] [address] [phone] Mon-Fri 7:00-5:00 [facility] [address], [place], [place] [address] [phone] Mon-Fri 8:00-5:00 Accepting Medicaid [facility] [address], [place], [place] [address] [phone] Mon-Fri 8:00-5:00 Accepting Medicaid  [place] ([address]) [facility] [place] [address] [place], [place] [address] [phone] Mon-Fri 8:00-5:00 [facility] [address], [place], [place] [address] [phone] Mon-Fri 8:00-5:00 Accepting Medicaid [facility] ([facility]) [address], [place], [place] [address] [phone] Mon-Fri 8:00-5:30, Sat&Sun by appointment (phones open at 8:30) Accepting Medicaid  [place] ([address]) [facility] [address] [place], [place] [address] [phone] Mon-Thur 8:00-7:00, Fri 8:00-5:00, Sat 8:00-12:00, Sun 9:00-12:00 Accepting Medicaid [facility] [address], [place], [place] [address] [phone] Mon-Thur 8:00-5:00 Accepting Medicaid [facility] [address] [place], [place] [address] [phone] Mon-Fri 8:00-5:30, Sat (Oct.-Mar.) 9:00-1:00 Accepting Medicaid  [place] ([address]) [facility] [address], [place], [place] [address] [phone] Mon-Fri 8:00-5:00 Accepting Medicaid   [place] ([address]) [facility] [place] [address], [place], [place] [address] [phone] Mon-Fri 8:00-5:00 [facility] [place] [address], [place], [place] [address] [phone] Mon-Fri 8:00-5:00 [facility] [place] [address] [place] [address], [place],  [address] [phone] Mon-Fri 8:00-5:00 After hours clinic ([address] [place],  [address]) [phone] Mon-Fri 5:00-8:00, Sat 12:00-6:00, Sun 10:00-4:00 Accepting Medicaid [facility] [place]   [address], [place], [place]  [address] [phone]   Fax - [phone]  [place] ([address]) [facility] [place] [facility] [address], [place], [place] [address] [phone] Mon-Fri 8:00-5:00 [facility] [place] ([facility] [place]) [address], [place], [place] [address] [phone] Mon-Thur 8:00-7:00, Fri 8:00-5:00, Sat 8:00-12:00    

## 2022-03-05 LAB — VITAMIN D 25 HYDROXY (VIT D DEFICIENCY, FRACTURES): Vit D, 25-Hydroxy: 31.5 ng/mL (ref 30.0–100.0)

## 2022-03-31 ENCOUNTER — Encounter (INDEPENDENT_AMBULATORY_CARE_PROVIDER_SITE_OTHER): Payer: Self-pay

## 2022-04-28 DIAGNOSIS — M25552 Pain in left hip: Secondary | ICD-10-CM | POA: Diagnosis not present

## 2022-05-25 ENCOUNTER — Other Ambulatory Visit: Payer: Self-pay | Admitting: General Practice

## 2022-05-25 ENCOUNTER — Ambulatory Visit (INDEPENDENT_AMBULATORY_CARE_PROVIDER_SITE_OTHER): Payer: Medicaid Other | Admitting: General Practice

## 2022-05-25 DIAGNOSIS — Z3042 Encounter for surveillance of injectable contraceptive: Secondary | ICD-10-CM | POA: Diagnosis not present

## 2022-05-25 DIAGNOSIS — A6004 Herpesviral vulvovaginitis: Secondary | ICD-10-CM

## 2022-05-25 MED ORDER — MEDROXYPROGESTERONE ACETATE 150 MG/ML IM SUSP
150.0000 mg | Freq: Once | INTRAMUSCULAR | Status: AC
  Administered 2022-05-25: 150 mg via INTRAMUSCULAR

## 2022-05-25 MED ORDER — VALACYCLOVIR HCL 1 G PO TABS: 1000.0000 mg | ORAL_TABLET | Freq: Two times a day (BID) | ORAL | 11 refills | Status: DC

## 2022-05-25 NOTE — Progress Notes (Signed)
Date last pap: 12-17-21. Last Depo-Provera: 03-04-22. Side Effects if any: Pt tolerated well. Depo-Provera 150 mg IM given by: Arlie Solomons, CMA in the left deltoid per pt request. Next appointment due 12/19-1/2.    Pt requesting refill for Valtrex. Rx sent to pharmacy.

## 2022-08-18 ENCOUNTER — Ambulatory Visit (INDEPENDENT_AMBULATORY_CARE_PROVIDER_SITE_OTHER): Payer: Medicaid Other | Admitting: General Practice

## 2022-08-18 VITALS — BP 149/91 | HR 103 | Ht 69.0 in | Wt >= 6400 oz

## 2022-08-18 DIAGNOSIS — Z3042 Encounter for surveillance of injectable contraceptive: Secondary | ICD-10-CM | POA: Diagnosis not present

## 2022-08-18 MED ORDER — MEDROXYPROGESTERONE ACETATE 150 MG/ML IM SUSP
150.0000 mg | Freq: Once | INTRAMUSCULAR | Status: AC
  Administered 2022-08-18: 150 mg via INTRAMUSCULAR

## 2022-08-18 NOTE — Progress Notes (Signed)
Date last pap: 12-17-21. Last Depo-Provera: 05-25-22. Side Effects if any: Pt tolerated well. Serum HCG indicated? NA. Depo-Provera 150 mg IM given by: Hope Pigeon, CMA in the left deltoid per pt request. Next appointment due 3/14-3/28.

## 2022-08-18 NOTE — Progress Notes (Deleted)
Patient was assessed and managed by nursing staff during this encounter. I have reviewed the chart and agree with the documentation and plan. I have also made any necessary editorial changes.  Lennart Pall, MD 08/18/2022 6:08 PM

## 2022-10-01 ENCOUNTER — Emergency Department (HOSPITAL_BASED_OUTPATIENT_CLINIC_OR_DEPARTMENT_OTHER)
Admission: EM | Admit: 2022-10-01 | Discharge: 2022-10-01 | Disposition: A | Discharge status: Home / Self Care | Payer: Medicaid Other | Attending: Emergency Medicine | Admitting: Emergency Medicine

## 2022-10-01 ENCOUNTER — Other Ambulatory Visit: Payer: Self-pay

## 2022-10-01 ENCOUNTER — Encounter (HOSPITAL_BASED_OUTPATIENT_CLINIC_OR_DEPARTMENT_OTHER): Payer: Self-pay

## 2022-10-01 DIAGNOSIS — I10 Essential (primary) hypertension: Secondary | ICD-10-CM | POA: Insufficient documentation

## 2022-10-01 DIAGNOSIS — N3001 Acute cystitis with hematuria: Secondary | ICD-10-CM | POA: Insufficient documentation

## 2022-10-01 DIAGNOSIS — D72829 Elevated white blood cell count, unspecified: Secondary | ICD-10-CM | POA: Diagnosis not present

## 2022-10-01 DIAGNOSIS — Z79899 Other long term (current) drug therapy: Secondary | ICD-10-CM | POA: Diagnosis not present

## 2022-10-01 DIAGNOSIS — R300 Dysuria: Secondary | ICD-10-CM | POA: Diagnosis present

## 2022-10-01 LAB — URINALYSIS, ROUTINE W REFLEX MICROSCOPIC
Bacteria, UA: NONE SEEN
Bilirubin Urine: NEGATIVE
Glucose, UA: NEGATIVE mg/dL
Ketones, ur: NEGATIVE mg/dL
Nitrite: NEGATIVE
Protein, ur: NEGATIVE mg/dL
Specific Gravity, Urine: 1.013 (ref 1.005–1.030)
WBC, UA: >50 WBC/hpf (ref 0–5)
pH: 6.5 (ref 5.0–8.0)

## 2022-10-01 LAB — PREGNANCY, URINE: Preg Test, Ur: NEGATIVE

## 2022-10-01 MED ORDER — CEPHALEXIN 500 MG PO CAPS: 500.0000 mg | ORAL_CAPSULE | Freq: Two times a day (BID) | ORAL | 0 refills | Status: DC

## 2022-10-01 MED ORDER — NITROFURANTOIN MONOHYD MACRO 100 MG PO CAPS: 100.0000 mg | ORAL_CAPSULE | Freq: Two times a day (BID) | ORAL | 0 refills | Status: DC

## 2022-10-01 NOTE — ED Provider Notes (Signed)
[facility] Provider Note   CSN: [id number] Arrival date & time: 10/01/22  1456     History Chief Complaint  Patient presents with   Dysuria    MAC Patricia Duran is a 27 y.o. female with history of herpes presents emerged from today for evaluation of urinary urgency, urinary pressure, since earlier today.  Patient reports that this feels similar to her previous UTIs.  She reports that she has been having some pressure with urination.  She also ports some hematuria although she thinks this is coming from her vagina from her period.  She does not have any fevers, abdominal pain, nausea, vomiting, or back pain.  Is not concerned for any STDs.  No vaginal discharge.   Dysuria Associated symptoms: no abdominal pain, no fever, no flank pain, no nausea, no vaginal discharge and no vomiting        Home Medications Prior to Admission medications   Medication Sig Start Date End Date Taking? Authorizing Provider  cephALEXin (KEFLEX) 500 MG capsule Take 1 capsule (500 mg total) by mouth 2 (two) times daily. 10/01/22  Yes Sherrell Puller, PA-C  buPROPion (WELLBUTRIN XL) 150 MG 24 hr tablet Take 1 tablet (150 mg total) by mouth daily. Patient not taking: Reported on 12/17/2021 10/16/20   Briscoe Deutscher, DO  Cholecalciferol (VITAMIN D) 50 MCG (2000 UT) CAPS Take 1 capsule (2,000 Units total) by mouth daily before breakfast. 12/17/21   Shelly Bombard, MD  medroxyPROGESTERone (DEPO-PROVERA) 150 MG/ML injection INJECT 1ML IN THE MUSCLE EVERY 3 MONTHS 12/17/21   Shelly Bombard, MD  meloxicam (MOBIC) 15 MG tablet Take 15 mg by mouth daily. Patient not taking: Reported on 12/17/2021    [provider]  methocarbamol (ROBAXIN) 500 MG tablet Take 1 tablet (500 mg total) by mouth 2 (two) times daily. 02/03/22   Talbot Grumbling, FNP  metroNIDAZOLE (FLAGYL) 500 MG tablet Take 1 tablet (500 mg total) by mouth 2 (two) times daily. Patient not taking:  Reported on 05/25/2022 12/21/21   Shelly Bombard, MD  naproxen (NAPROSYN) 500 MG tablet Take 1 tablet (500 mg total) by mouth 2 (two) times daily. 02/03/22   Talbot Grumbling, FNP  phenazopyridine (PYRIDIUM) 200 MG tablet Take 1 tablet (200 mg total) by mouth 3 (three) times daily as needed for pain. Patient not taking: Reported on 12/17/2021 04/28/21   Shelly Bombard, MD  Prenat-FeFum-DSS-FA-DHA w/o A (PNV-DHA+DOCUSATE) 27-1.25-300 MG CAPS Take 1 capsule by mouth daily before breakfast. 12/17/21   Shelly Bombard, MD  tiZANidine (ZANAFLEX) 4 MG tablet Take 4 mg by mouth every 6 (six) hours as needed. Patient not taking: Reported on 12/17/2021 02/14/21   [provider]  valACYclovir (VALTREX) 1000 MG tablet Take 1 tablet (1,000 mg total) by mouth 2 (two) times daily. 05/25/22   Sloan Leiter, MD  Norethin Ace-Eth Estrad-FE (TAYTULLA) 1-20 MG-MCG(24) CAPS Take 1 capsule by mouth daily before breakfast. 07/11/19 08/09/19  Shelly Bombard, MD      Allergies    Patient has no known allergies.    Review of Systems   Review of Systems  Constitutional:  Negative for chills and fever.  Respiratory:  Negative for shortness of breath.   Cardiovascular:  Negative for chest pain.  Gastrointestinal:  Negative for abdominal pain, constipation, diarrhea, nausea and vomiting.  Genitourinary:  Positive for dysuria, frequency, hematuria and urgency. Negative for flank pain, vaginal bleeding, vaginal discharge and vaginal pain.  Musculoskeletal:  Negative for back pain.    Physical Exam Updated Vital Signs BP (!) 161/106 (BP Location: Right Arm)   Pulse 91   Temp 98 F (36.7 C) (Oral)   Resp 18   Ht 5' 9"$  (1.753 m)   Wt (!) 187.6 kg   SpO2 100%   BMI 61.08 kg/m  Physical Exam Vitals and nursing note reviewed.  Constitutional:      General: She is not in acute distress.    Appearance: Normal appearance. She is not ill-appearing or toxic-appearing.  HENT:     Head: Normocephalic  and atraumatic.  Eyes:     General: No scleral icterus. Cardiovascular:     Rate and Rhythm: Normal rate and regular rhythm.  Pulmonary:     Effort: Pulmonary effort is normal. No respiratory distress.  Abdominal:     General: Bowel sounds are normal.     Palpations: Abdomen is soft.     Tenderness: There is no abdominal tenderness. There is no right CVA tenderness, left CVA tenderness, guarding or rebound.  Genitourinary:    Comments: Patient declined Musculoskeletal:        General: No deformity.     Cervical back: Normal range of motion.  Skin:    General: Skin is warm and dry.  Neurological:     General: No focal deficit present.     Mental Status: She is alert. Mental status is at baseline.     ED Results / Procedures / Treatments   Labs (all labs ordered are listed, but only abnormal results are displayed) Labs Reviewed  URINALYSIS, ROUTINE W REFLEX MICROSCOPIC - Abnormal; Notable for the following components:      Result Value   APPearance HAZY (*)    Hgb urine dipstick MODERATE (*)    Leukocytes,Ua LARGE (*)    All other components within normal limits  URINE CULTURE  PREGNANCY, URINE    EKG None  Radiology No results found.  Procedures Procedures   Medications Ordered in ED Medications - No data to display  ED Course/ Medical Decision Making/ A&P                           Medical Decision Making Amount and/or Complexity of Data Reviewed Labs: ordered.  Risk Prescription drug management.   27 year old female presents emerged from today for evaluation of dysuria.  Differential diagnosis includes but is not limited to UTI, urethritis, PID, yeast infection, interstitial cystitis.  Vital signs show elevated blood pressure 161/106, afebrile, mild tachycardic however when I was in the room the patient was around 84.  Satting well on room air without increased work of breathing.  Physical exam as noted above.  I independently reviewed and interpreted  the patient's labs.  Pregnancy test is negative.  Urinalysis shows hazy urine with mod amount of hemoglobin.  There is large amount of leukocytes with greater than 50 white blood cells however no bacteria seen.  21-50 white blood cells present as well.  Given that she feels that this is a UTI and has had multiple UTIs in the past.  This is her urine is suspicious for infection.  Have added on a culture.  From previous chart evaluation, it appears the patient usually grows out STAPHYLOCOCCUS SAPROPHYTICUS.  Started the patient on Keflex, but cultures showed that it was resistant to Oxacillin. Will start the patient on Macrobid.   I do not think this patient need any additional imaging.  She does not have any abdominal tenderness or any flank tenderness.  No CVA tenderness.  Her vital signs do show some hypertension however she is afebrile with a normal heart rate.  She is otherwise well-appearing in no acute distress.  Considered admission however patient does not meet the criteria at this time and can be discharged home with antibiotics.  We discussed return precautions red flag symptoms.  Patient verbalizes understanding agrees the plan.  Patient is stable being discharged home in good condition.   Final Clinical Impression(s) / ED Diagnoses Final diagnoses:  Acute cystitis with hematuria    Rx / DC Orders ED Discharge Orders          Ordered    cephALEXin (KEFLEX) 500 MG capsule  2 times daily,   Status:  Discontinued        10/01/22 1639    nitrofurantoin, macrocrystal-monohydrate, (MACROBID) 100 MG capsule  2 times daily        10/01/22 1701              Sherrell Puller, PA-C 10/01/22 1706    Drenda Freeze, MD 10/01/22 [phone]

## 2022-10-01 NOTE — ED Triage Notes (Signed)
Patient here POV from Work.  Endorses Dysuria and Frequency that the Patient noted Today. More Pressure-Like with Urination than Burning.  NAD Noted during Triage. A&Ox4. GCS 15. Ambulatory.

## 2022-10-01 NOTE — Discharge Instructions (Addendum)
You were seen in the ER for evaluation of your dysuria. Your urine was suspicious for an infection. I have sent in an antibiotic for your UTI. Please take as directed and make sure to finish the entirety of the course. Make sure you are drinking plenty of water and staying well hydrated. Also, make sure you are taking a calcium supplement with your depo. If you have any back pain, abdominal pain, nausea, vomiting, fevers, please return to the nearest ER for evaluation.   Contact a doctor if: You do not get better after 1-2 days. Your symptoms go away and then come back. Get help right away if: You have very bad back pain. You have very bad pain in your lower belly. You have a fever. You have chills. You feeling like you will vomit or you vomit.

## 2022-10-03 LAB — URINE CULTURE

## 2022-11-10 ENCOUNTER — Ambulatory Visit (INDEPENDENT_AMBULATORY_CARE_PROVIDER_SITE_OTHER): Payer: Medicaid Other

## 2022-11-10 VITALS — BP 137/87 | HR 81 | Wt >= 6400 oz

## 2022-11-10 DIAGNOSIS — Z3042 Encounter for surveillance of injectable contraceptive: Secondary | ICD-10-CM | POA: Diagnosis not present

## 2022-11-10 MED ORDER — MEDROXYPROGESTERONE ACETATE 150 MG/ML IM SUSY
150.0000 mg | PREFILLED_SYRINGE | INTRAMUSCULAR | Status: AC
  Administered 2022-11-10: 150 mg via INTRAMUSCULAR

## 2022-11-10 NOTE — Progress Notes (Signed)
Date last pap: 12/17/21. Last Depo-Provera: 08/18/22. Side Effects if any: None. Serum HCG indicated? N/a. Depo-Provera 150 mg IM given by: Abhay Godbolt L CMA . Next appointment due 06/05-06/19.

## 2022-11-16 ENCOUNTER — Ambulatory Visit: Payer: Medicaid Other | Admitting: Obstetrics

## 2022-12-29 ENCOUNTER — Other Ambulatory Visit (HOSPITAL_COMMUNITY)
Admission: RE | Admit: 2022-12-29 | Discharge: 2022-12-29 | Disposition: A | Discharge status: Home / Self Care | Payer: Medicaid Other | Source: Ambulatory Visit | Attending: Obstetrics | Admitting: Obstetrics

## 2022-12-29 ENCOUNTER — Encounter: Payer: Self-pay | Admitting: Obstetrics

## 2022-12-29 ENCOUNTER — Ambulatory Visit (INDEPENDENT_AMBULATORY_CARE_PROVIDER_SITE_OTHER): Payer: Medicaid Other | Admitting: Obstetrics

## 2022-12-29 VITALS — BP 126/88 | HR 88 | Ht 69.0 in | Wt >= 6400 oz

## 2022-12-29 DIAGNOSIS — N898 Other specified noninflammatory disorders of vagina: Secondary | ICD-10-CM | POA: Insufficient documentation

## 2022-12-29 DIAGNOSIS — A6004 Herpesviral vulvovaginitis: Secondary | ICD-10-CM | POA: Diagnosis not present

## 2022-12-29 DIAGNOSIS — Z3042 Encounter for surveillance of injectable contraceptive: Secondary | ICD-10-CM | POA: Diagnosis not present

## 2022-12-29 DIAGNOSIS — Z6841 Body Mass Index (BMI) 40.0 and over, adult: Secondary | ICD-10-CM

## 2022-12-29 DIAGNOSIS — E559 Vitamin D deficiency, unspecified: Secondary | ICD-10-CM

## 2022-12-29 DIAGNOSIS — Z01419 Encounter for gynecological examination (general) (routine) without abnormal findings: Secondary | ICD-10-CM | POA: Insufficient documentation

## 2022-12-29 DIAGNOSIS — Z113 Encounter for screening for infections with a predominantly sexual mode of transmission: Secondary | ICD-10-CM

## 2022-12-29 DIAGNOSIS — F17200 Nicotine dependence, unspecified, uncomplicated: Secondary | ICD-10-CM | POA: Diagnosis not present

## 2022-12-29 DIAGNOSIS — N760 Acute vaginitis: Secondary | ICD-10-CM | POA: Insufficient documentation

## 2022-12-29 MED ORDER — VITAMIN D 50 MCG (2000 UT) PO CAPS: 1.0000 | ORAL_CAPSULE | Freq: Every day | ORAL | 11 refills | Status: DC

## 2022-12-29 MED ORDER — MEDROXYPROGESTERONE ACETATE 150 MG/ML IM SUSP: 150.0000 mg | INTRAMUSCULAR | 4 refills | Status: AC

## 2022-12-29 MED ORDER — VALACYCLOVIR HCL 1 G PO TABS: 1000.0000 mg | ORAL_TABLET | Freq: Two times a day (BID) | ORAL | 11 refills | Status: DC

## 2022-12-29 MED ORDER — PHENTERMINE HCL 37.5 MG PO CAPS: 37.5000 mg | ORAL_CAPSULE | ORAL | 2 refills | Status: AC

## 2022-12-29 MED ORDER — NAPROXEN 500 MG PO TABS: 500.0000 mg | ORAL_TABLET | Freq: Two times a day (BID) | ORAL | 5 refills | Status: DC

## 2022-12-29 MED ORDER — MEDROXYPROGESTERONE ACETATE 150 MG/ML IM SUSP
150.0000 mg | Freq: Once | INTRAMUSCULAR | Status: AC
  Administered 2023-02-02: 150 mg via INTRAMUSCULAR

## 2022-12-29 NOTE — Progress Notes (Signed)
PT is in the office for annual Last pap 12/17/2021 Currently on depo

## 2022-12-29 NOTE — Progress Notes (Signed)
Subjective:        Patricia Duran is a 27 y.o. female here for a routine exam.  Current complaints: Vaginal discharge.    Personal health questionnaire:  Is patient Ashkenazi Jewish, have a family history of breast and/or ovarian cancer: no Is there a family history of uterine cancer diagnosed at age < 58, gastrointestinal cancer, urinary tract cancer, family member who is a Personnel officer syndrome-associated carrier: no Is the patient overweight and hypertensive, family history of diabetes, personal history of gestational diabetes, preeclampsia or PCOS: no Is patient over 68, have PCOS,  family history of premature CHD under age 23, diabetes, smoke, have hypertension or peripheral artery disease:  no At any time, has a partner hit, kicked or otherwise hurt or frightened you?: no Over the past 2 weeks, have you felt down, depressed or hopeless?: no Over the past 2 weeks, have you felt little interest or pleasure in doing things?:no   Gynecologic History No LMP recorded. Patient has had an injection. Contraception: Depo-Provera injections Last Pap: 2023. Results were: normal Last mammogram: n/a. Results were: n/a  Obstetric History OB History  Gravida Para Term Preterm AB Living  0 0 0 0 0 0  SAB IAB Ectopic Multiple Live Births  0 0 0 0 0    Past Medical History:  Diagnosis Date   Asthma    Back pain    GERD (gastroesophageal reflux disease)    HSV-2 (herpes simplex virus 2) infection    Muscle spasm    Obesity    Thyroid disease     Past Surgical History:  Procedure Laterality Date   TOE SURGERY     WISDOM TOOTH EXTRACTION       Current Outpatient Medications:    medroxyPROGESTERone (DEPO-PROVERA) 150 MG/ML injection, INJECT IN THE MUSCLE EVERY 3 MONTHS, Disp: 1 mL, Rfl: 4   medroxyPROGESTERone (DEPO-PROVERA) 150 MG/ML injection, Inject 1 mL (150 mg total) into the muscle every 3 (three) months., Disp: 1 mL, Rfl: 4   phentermine 37.5 MG capsule, Take 1 capsule  (37.5 mg total) by mouth every morning., Disp: 30 capsule, Rfl: 2   buPROPion (WELLBUTRIN XL) 150 MG 24 hr tablet, Take 1 tablet (150 mg total) by mouth daily. (Patient not taking: Reported on 12/17/2021), Disp: 90 tablet, Rfl: 0   Cholecalciferol (VITAMIN D) 50 MCG (2000 UT) CAPS, Take 1 capsule (2,000 Units total) by mouth daily before breakfast., Disp: 30 capsule, Rfl: 11   methocarbamol (ROBAXIN) 500 MG tablet, Take 1 tablet (500 mg total) by mouth 2 (two) times daily. (Patient not taking: Reported on 12/29/2022), Disp: 20 tablet, Rfl: 0   metroNIDAZOLE (FLAGYL) 500 MG tablet, Take 1 tablet (500 mg total) by mouth 2 (two) times daily. (Patient not taking: Reported on 05/25/2022), Disp: 14 tablet, Rfl: 2   naproxen (NAPROSYN) 500 MG tablet, Take 1 tablet (500 mg total) by mouth 2 (two) times daily., Disp: 30 tablet, Rfl: 5   nitrofurantoin, macrocrystal-monohydrate, (MACROBID) 100 MG capsule, Take 1 capsule (100 mg total) by mouth 2 (two) times daily. (Patient not taking: Reported on 12/29/2022), Disp: 10 capsule, Rfl: 0   phenazopyridine (PYRIDIUM) 200 MG tablet, Take 1 tablet (200 mg total) by mouth 3 (three) times daily as needed for pain. (Patient not taking: Reported on 12/17/2021), Disp: 10 tablet, Rfl: 0   Prenat-FeFum-DSS-FA-DHA w/o A (PNV-DHA+DOCUSATE) 27-1.25-300 MG CAPS, Take 1 capsule by mouth daily before breakfast. (Patient not taking: Reported on 12/29/2022), Disp: 90 capsule, Rfl: 4  tiZANidine (ZANAFLEX) 4 MG tablet, Take 4 mg by mouth every 6 (six) hours as needed. (Patient not taking: Reported on 12/17/2021), Disp: , Rfl:    valACYclovir (VALTREX) 1000 MG tablet, Take 1 tablet (1,000 mg total) by mouth 2 (two) times daily. Take for 5 days prn each outbreak., Disp: 30 tablet, Rfl: 11  Current Facility-Administered Medications:    medroxyPROGESTERone (DEPO-PROVERA) injection 150 mg, 150 mg, Intramuscular, Q90 days, Conan Bowens, MD, 150 mg at 04/15/21 1030   medroxyPROGESTERone  (DEPO-PROVERA) injection 150 mg, 150 mg, Intramuscular, Once, Brock Bad, MD   medroxyPROGESTERone Acetate SUSY 150 mg, 150 mg, Intramuscular, Q90 days, Constant, Peggy, MD, 150 mg at 11/10/22 1135 No Known Allergies  Social History   Tobacco Use   Smoking status: Every Day    Types: Cigars   Smokeless tobacco: Never   Tobacco comments:    black and milds  Substance Use Topics   Alcohol use: Yes    Comment: occ    Family History  Problem Relation Age of Onset   Hypertension Mother    Asthma Mother    Sleep apnea Mother    Obesity Mother    Hypertension Father    Cancer Maternal Grandmother    Anxiety disorder Maternal Grandmother    Asthma Other    Hypertension Other    Hyperlipidemia Other    Sleep apnea Other    Stroke Other    Heart attack Other       Review of Systems  Constitutional: negative for fatigue and weight loss Respiratory: negative for cough and wheezing Cardiovascular: negative for chest pain, fatigue and palpitations Gastrointestinal: negative for abdominal pain and change in bowel habits Musculoskeletal:negative for myalgias Neurological: negative for gait problems and tremors Behavioral/Psych: negative for abusive relationship, depression Endocrine: negative for temperature intolerance    Genitourinary: positive for vaginal discharge.  negative for abnormal menstrual periods, genital lesions, hot flashes, sexual problems  Integument/breast: negative for breast lump, breast tenderness, nipple discharge and skin lesion(s)    Objective:       BP 126/88   Pulse 88   Ht 5\' 9"  (1.753 m)   Wt (!) 418 lb 8 oz (189.8 kg)   BMI 61.80 kg/m  General:   Alert and no distress  Skin:   no rash or abnormalities  Lungs:   clear to auscultation bilaterally  Heart:   regular rate and rhythm, S1, S2 normal, no murmur, click, rub or gallop  Breasts:   normal without suspicious masses, skin or nipple changes or axillary nodes  Abdomen:  normal  findings: no organomegaly, soft, non-tender and no hernia  Pelvis:  External genitalia: normal general appearance Urinary system: urethral meatus normal and bladder without fullness, nontender Vaginal: normal without tenderness, induration or masses Cervix: normal appearance Adnexa: normal bimanual exam Uterus: anteverted and non-tender, normal size   Lab Review Urine pregnancy test Labs reviewed yes Radiologic studies reviewed no  I have spent a total of 20 minutes of face-to-face time, excluding clinical staff time, reviewing notes and preparing to see patient, ordering tests and/or medications, and counseling the patient.   Assessment:    1. Encounter for gynecological examination with Papanicolaou smear of cervix Rx - Cytology - PAP( [facility]) - CBC - TSH - Hemoglobin A1c - Ferritin - VITAMIN D 25 Hydroxy (Vit-D Deficiency, Fractures) - Comprehensive metabolic panel - naproxen (NAPROSYN) 500 MG tablet; Take 1 tablet (500 mg total) by mouth 2 (two) times daily.  Dispense: 30  tablet; Refill: 5  2. Vaginal discharge Rx: - Cervicovaginal ancillary only( [facility])  3. Herpes simplex vulvovaginitis Rx  - valACYclovir (VALTREX) 1000 MG tablet; Take 1 tablet (1,000 mg total) by mouth 2 (two) times daily. Take for 5 days prn each outbreak.  Dispense: 30 tablet; Refill: 11  4. Screening for STD (sexually transmitted disease) Rx: - HIV antibody (with reflex) - RPR - Hepatitis B Surface AntiGEN - Hepatitis C Antibody  5. Encounter for surveillance of injectable contraceptive Rx: - medroxyPROGESTERone (DEPO-PROVERA) injection 150 mg - medroxyPROGESTERone (DEPO-PROVERA) 150 MG/ML injection; Inject 1 mL (150 mg total) into the muscle every 3 (three) months.  Dispense: 1 mL; Refill: 4  6. Class 3 severe obesity without serious comorbidity with body mass index (BMI) of 60.0 to 69.9 in adult, unspecified obesity type (HCC) Rx: - phentermine 37.5 MG capsule; Take 1 capsule  (37.5 mg total) by mouth every morning.  Dispense: 30 capsule; Refill: 2 - [facility] referral to [facility]  7. Tobacco dependence - cessation recommended  8. Vitamin D deficiency Rx: - Cholecalciferol (VITAMIN D) 50 MCG (2000 UT) CAPS; Take 1 capsule (2,000 Units total) by mouth daily before breakfast.  Dispense: 30 capsule; Refill: 11       Plan:    Education reviewed: calcium supplements, depression evaluation, low fat, low cholesterol diet, safe sex/STD prevention, self breast exams, smoking cessation, and weight bearing exercise. Contraception: Depo-Provera injections. Follow up in: 1 year.   Meds ordered this encounter  Medications   medroxyPROGESTERone (DEPO-PROVERA) injection 150 mg   medroxyPROGESTERone (DEPO-PROVERA) 150 MG/ML injection    Sig: Inject 1 mL (150 mg total) into the muscle every 3 (three) months.    Dispense:  1 mL    Refill:  4   valACYclovir (VALTREX) 1000 MG tablet    Sig: Take 1 tablet (1,000 mg total) by mouth 2 (two) times daily. Take for 5 days prn each outbreak.    Dispense:  30 tablet    Refill:  11   naproxen (NAPROSYN) 500 MG tablet    Sig: Take 1 tablet (500 mg total) by mouth 2 (two) times daily.    Dispense:  30 tablet    Refill:  5   Cholecalciferol (VITAMIN D) 50 MCG (2000 UT) CAPS    Sig: Take 1 capsule (2,000 Units total) by mouth daily before breakfast.    Dispense:  30 capsule    Refill:  11   phentermine 37.5 MG capsule    Sig: Take 1 capsule (37.5 mg total) by mouth every morning.    Dispense:  30 capsule    Refill:  2   Orders Placed This Encounter  Procedures   HIV antibody (with reflex)   RPR   Hepatitis B Surface AntiGEN   Hepatitis C Antibody   CBC   TSH   Hemoglobin A1c   Ferritin   VITAMIN D 25 Hydroxy (Vit-D Deficiency, Fractures)   Comprehensive metabolic panel   Ambulatory referral to Family Practice    Referral Priority:   Routine    Referral Type:   Consultation    Referral Reason:   Specialty  Services Required    Requested Specialty:   Family Medicine    Number of Visits Requested:   1    Brock Bad, MD 12/29/2022 12:50 PM

## 2022-12-30 LAB — COMPREHENSIVE METABOLIC PANEL
ALT: 15 IU/L (ref 0–32)
AST: 17 IU/L (ref 0–40)
Albumin/Globulin Ratio: 1.2 (ref 1.2–2.2)
Albumin: 3.9 g/dL — ABNORMAL LOW (ref 4.0–5.0)
Alkaline Phosphatase: 73 IU/L (ref 44–121)
BUN/Creatinine Ratio: 10 (ref 9–23)
BUN: 7 mg/dL (ref 6–20)
Bilirubin Total: 0.4 mg/dL (ref 0.0–1.2)
CO2: 20 mmol/L (ref 20–29)
Calcium: 9.2 mg/dL (ref 8.7–10.2)
Chloride: 106 mmol/L (ref 96–106)
Creatinine, Ser: 0.69 mg/dL (ref 0.57–1.00)
Globulin, Total: 3.3 g/dL (ref 1.5–4.5)
Glucose: 98 mg/dL (ref 70–99)
Potassium: 4.1 mmol/L (ref 3.5–5.2)
Sodium: 140 mmol/L (ref 134–144)
Total Protein: 7.2 g/dL (ref 6.0–8.5)
eGFR: 123 mL/min/{1.73_m2} (ref >59)

## 2022-12-30 LAB — CERVICOVAGINAL ANCILLARY ONLY
Bacterial Vaginitis (gardnerella): POSITIVE — AB
Candida Glabrata: NEGATIVE
Candida Vaginitis: NEGATIVE
Chlamydia: NEGATIVE
Comment: NEGATIVE
Comment: NEGATIVE
Comment: NEGATIVE
Comment: NEGATIVE
Comment: NEGATIVE
Comment: NORMAL
Neisseria Gonorrhea: NEGATIVE
Trichomonas: NEGATIVE

## 2022-12-30 LAB — CBC
Hematocrit: 41.5 % (ref 34.0–46.6)
Hemoglobin: 13.6 g/dL (ref 11.1–15.9)
MCH: 27.6 pg (ref 26.6–33.0)
MCHC: 32.8 g/dL (ref 31.5–35.7)
MCV: 84 fL (ref 79–97)
Platelets: 250 10*3/uL (ref 150–450)
RBC: 4.93 x10E6/uL (ref 3.77–5.28)
RDW: 13.3 % (ref 11.7–15.4)
WBC: 4 10*3/uL (ref 3.4–10.8)

## 2022-12-30 LAB — HEPATITIS B SURFACE ANTIGEN: Hepatitis B Surface Ag: NEGATIVE

## 2022-12-30 LAB — TSH: TSH: 3.74 u[IU]/mL (ref 0.450–4.500)

## 2022-12-30 LAB — HIV ANTIBODY (ROUTINE TESTING W REFLEX): HIV Screen 4th Generation wRfx: NONREACTIVE

## 2022-12-30 LAB — HEMOGLOBIN A1C
Est. average glucose Bld gHb Est-mCnc: 97 mg/dL
Hgb A1c MFr Bld: 5 % (ref 4.8–5.6)

## 2022-12-30 LAB — VITAMIN D 25 HYDROXY (VIT D DEFICIENCY, FRACTURES): Vit D, 25-Hydroxy: 16.5 ng/mL — ABNORMAL LOW (ref 30.0–100.0)

## 2022-12-30 LAB — FERRITIN: Ferritin: 150 ng/mL (ref 15–150)

## 2022-12-30 LAB — RPR: RPR Ser Ql: NONREACTIVE

## 2022-12-30 LAB — HEPATITIS C ANTIBODY: Hep C Virus Ab: NONREACTIVE

## 2022-12-31 ENCOUNTER — Other Ambulatory Visit: Payer: Self-pay | Admitting: Obstetrics

## 2022-12-31 DIAGNOSIS — B9689 Other specified bacterial agents as the cause of diseases classified elsewhere: Secondary | ICD-10-CM

## 2022-12-31 LAB — CYTOLOGY - PAP: Diagnosis: NEGATIVE

## 2022-12-31 MED ORDER — METRONIDAZOLE 500 MG PO TABS: 500.0000 mg | ORAL_TABLET | Freq: Two times a day (BID) | ORAL | 2 refills | Status: DC

## 2022-12-31 NOTE — Progress Notes (Signed)
[facility] message sent advising of DX and RX sent. Education included in message.

## 2023-01-24 ENCOUNTER — Telehealth: Payer: Medicaid Other | Admitting: Physician Assistant

## 2023-01-24 DIAGNOSIS — R3989 Other symptoms and signs involving the genitourinary system: Secondary | ICD-10-CM

## 2023-01-24 MED ORDER — CEPHALEXIN 500 MG PO CAPS: 500.0000 mg | ORAL_CAPSULE | Freq: Two times a day (BID) | ORAL | 0 refills | Status: AC

## 2023-01-24 NOTE — Progress Notes (Signed)

## 2023-01-24 NOTE — Progress Notes (Signed)
I have spent 5 minutes in review of e-visit questionnaire, review and updating patient chart, medical decision making and response to patient.   Patricia Duran Cody Flemon Kelty, PA-C    

## 2023-02-02 ENCOUNTER — Ambulatory Visit (INDEPENDENT_AMBULATORY_CARE_PROVIDER_SITE_OTHER): Payer: Medicaid Other | Admitting: Emergency Medicine

## 2023-02-02 VITALS — BP 150/94 | HR 113 | Ht 69.0 in | Wt >= 6400 oz

## 2023-02-02 DIAGNOSIS — Z3042 Encounter for surveillance of injectable contraceptive: Secondary | ICD-10-CM | POA: Diagnosis not present

## 2023-02-02 MED ORDER — MEDROXYPROGESTERONE ACETATE 150 MG/ML IM SUSP
150.0000 mg | Freq: Once | INTRAMUSCULAR | Status: AC
  Administered 2023-02-02: 150 mg via INTRAMUSCULAR

## 2023-02-02 NOTE — Progress Notes (Signed)
Date last pap: 12/29/2022 Last Depo-Provera: 11/10/22. Side Effects if any: NONE. Serum HCG indicated? NA. Depo-Provera 150 mg IM given by: Resa Miner, RN into LD, tolerated well. Next appointment due Aug 28- Sep 11.

## 2023-02-24 ENCOUNTER — Telehealth: Payer: Medicaid Other | Admitting: Emergency Medicine

## 2023-02-24 DIAGNOSIS — N898 Other specified noninflammatory disorders of vagina: Secondary | ICD-10-CM

## 2023-02-25 ENCOUNTER — Telehealth: Payer: Medicaid Other | Admitting: Nurse Practitioner

## 2023-02-25 ENCOUNTER — Emergency Department (HOSPITAL_BASED_OUTPATIENT_CLINIC_OR_DEPARTMENT_OTHER)
Admission: EM | Admit: 2023-02-25 | Discharge: 2023-02-26 | Disposition: A | Discharge status: Home / Self Care | Payer: Medicaid Other | Attending: Emergency Medicine | Admitting: Emergency Medicine

## 2023-02-25 ENCOUNTER — Other Ambulatory Visit: Payer: Self-pay

## 2023-02-25 ENCOUNTER — Encounter (HOSPITAL_BASED_OUTPATIENT_CLINIC_OR_DEPARTMENT_OTHER): Payer: Self-pay

## 2023-02-25 DIAGNOSIS — N898 Other specified noninflammatory disorders of vagina: Secondary | ICD-10-CM | POA: Diagnosis present

## 2023-02-25 DIAGNOSIS — A599 Trichomoniasis, unspecified: Secondary | ICD-10-CM | POA: Insufficient documentation

## 2023-02-25 LAB — URINALYSIS, ROUTINE W REFLEX MICROSCOPIC
Bilirubin Urine: NEGATIVE
Glucose, UA: NEGATIVE mg/dL
Nitrite: NEGATIVE
Specific Gravity, Urine: 1.037 — ABNORMAL HIGH (ref 1.005–1.030)
WBC, UA: >50 WBC/hpf (ref 0–5)
pH: 6.5 (ref 5.0–8.0)

## 2023-02-25 LAB — WET PREP, GENITAL
Clue Cells Wet Prep HPF POC: NONE SEEN
Sperm: NONE SEEN
WBC, Wet Prep HPF POC: >=10 — AB (ref <10)
Yeast Wet Prep HPF POC: NONE SEEN

## 2023-02-25 LAB — PREGNANCY, URINE: Preg Test, Ur: NEGATIVE

## 2023-02-25 NOTE — ED Provider Notes (Incomplete)
[place] EMERGENCY DEPARTMENT AT [facility] Provider Note   CSN: [id number] Arrival date & time: 02/25/23  1949     History {Add pertinent medical, surgical, social history, OB history to HPI:1} Chief Complaint  Patient presents with  . ***    Patricia Duran is a 27 y.o. female with a history of dysmenorrhea, vitamin D deficiency, and obesity presents the ED today for GU concerns.  Patient reports that she has been having vaginal itchiness, dryness, and malodorous discharge that has been worsening since Monday.  She denies any fever, urinary symptoms, or vaginal pain.    Home Medications Prior to Admission medications   Medication Sig Start Date End Date Taking? Authorizing Provider  buPROPion (WELLBUTRIN XL) 150 MG 24 hr tablet Take 1 tablet (150 mg total) by mouth daily. Patient not taking: Reported on 12/17/2021 10/16/20   Helane Rima, DO  Cholecalciferol (VITAMIN D) 50 MCG (2000 UT) CAPS Take 1 capsule (2,000 Units total) by mouth daily before breakfast. 12/29/22   Brock Bad, MD  medroxyPROGESTERone (DEPO-PROVERA) 150 MG/ML injection INJECT IN THE MUSCLE EVERY 3 MONTHS 12/17/21   Brock Bad, MD  medroxyPROGESTERone (DEPO-PROVERA) 150 MG/ML injection Inject 1 mL (150 mg total) into the muscle every 3 (three) months. 12/29/22   Brock Bad, MD  methocarbamol (ROBAXIN) 500 MG tablet Take 1 tablet (500 mg total) by mouth 2 (two) times daily. Patient not taking: Reported on 12/29/2022 02/03/22   Carlisle Beers, FNP  naproxen (NAPROSYN) 500 MG tablet Take 1 tablet (500 mg total) by mouth 2 (two) times daily. 12/29/22   Brock Bad, MD  phenazopyridine (PYRIDIUM) 200 MG tablet Take 1 tablet (200 mg total) by mouth 3 (three) times daily as needed for pain. Patient not taking: Reported on 12/17/2021 04/28/21   Brock Bad, MD  phentermine 37.5 MG capsule Take 1 capsule (37.5 mg total) by mouth every morning. 12/29/22   Brock Bad, MD   Prenat-FeFum-DSS-FA-DHA w/o A (PNV-DHA+DOCUSATE) 27-1.25-300 MG CAPS Take 1 capsule by mouth daily before breakfast. Patient not taking: Reported on 12/29/2022 12/17/21   Brock Bad, MD  tiZANidine (ZANAFLEX) 4 MG tablet Take 4 mg by mouth every 6 (six) hours as needed. Patient not taking: Reported on 12/17/2021 02/14/21   [provider]  valACYclovir (VALTREX) 1000 MG tablet Take 1 tablet (1,000 mg total) by mouth 2 (two) times daily. Take for 5 days prn each outbreak. 12/29/22   Brock Bad, MD  Norethin Ace-Eth Estrad-FE (TAYTULLA) 1-20 MG-MCG(24) CAPS Take 1 capsule by mouth daily before breakfast. 07/11/19 08/09/19  Brock Bad, MD      Allergies    Patient has no known allergies.    Review of Systems   Review of Systems  Genitourinary:        Vaginal itching and malodorous discharge  All other systems reviewed and are negative.   Physical Exam Updated Vital Signs BP 136/85 (BP Location: Right Arm)   Pulse (!) 105   Temp 98.6 F (37 C)   Resp 18   Ht 5\' 8"  (1.727 m)   Wt (!) 188.7 kg   SpO2 100%   BMI 63.25 kg/m  Physical Exam Vitals and nursing note reviewed. Exam conducted with a chaperone present.  Constitutional:      Appearance: Normal appearance.  HENT:     Head: Normocephalic and atraumatic.     Mouth/Throat:     Mouth: Mucous membranes are moist.  Eyes:     Conjunctiva/sclera: Conjunctivae normal.     Pupils: Pupils are equal, round, and reactive to light.  Cardiovascular:     Rate and Rhythm: Normal rate and regular rhythm.     Pulses: Normal pulses.  Pulmonary:     Effort: Pulmonary effort is normal.     Breath sounds: Normal breath sounds.  Abdominal:     Palpations: Abdomen is soft.     Tenderness: There is no abdominal tenderness.  Genitourinary:    General: Normal vulva.     Comments: Yellow, malodorous vaginal discharge and erythematous cervix appreciated on pelvic exam.  No uterine, ovarian, or adnexal tenderness to  palpation on bimanual exam. Skin:    General: Skin is warm and dry.     Findings: No rash.  Neurological:     General: No focal deficit present.     Mental Status: She is alert.  Psychiatric:        Mood and Affect: Mood normal.        Behavior: Behavior normal.     ED Results / Procedures / Treatments   Labs (all labs ordered are listed, but only abnormal results are displayed) Labs Reviewed - No data to display  EKG None  Radiology No results found.  Procedures Procedures: not indicated. {Document cardiac monitor, telemetry assessment procedure when appropriate:1}  Medications Ordered in ED Medications - No data to display  ED Course/ Medical Decision Making/ A&P   {   Click here for ABCD2, HEART and other calculatorsREFRESH Note before signing :1}                          Medical Decision Making Amount and/or Complexity of Data Reviewed Labs: ordered.   This patient presents to the ED for concern of vaginal pruritus and malodorous discharge, this involves an extensive number of treatment options, and is a complaint that carries with it a high risk of complications and morbidity.   Differential diagnosis includes: STI versus UTI versus pregnancy, etc.   Co morbidities that complicate the patient evaluation  Obesity Dysmenorrhea Vitamin D deficiency   Additional history obtained:  Additional history obtained from patient's records.   Lab Tests:  I ordered and personally interpreted labs.  The pertinent results include:  Wet prep positive for trichomonas UA contaminated 6-10/HPF squamous epithelium noted.  Patient denies any urinary symptoms. Pregnancy test negative Gonorrhea, chlamydia, syphilis labs pending due to being send out test.  Informed patient I will call her and inform her of results and that she will need to come back if positive for treatment.   Problem List / ED Course / Critical interventions / Medication management  Vaginal  pruritus and malodorous discharge I ordered medications including:  ***  for ***  Reevaluation of the patient after these medicines showed that the patient {resolved/improved/worsened:23923::"improved"} I have reviewed the patients home medicines and have made adjustments as needed   Social Determinants of Health:  Access to healthcare Social connection   Test / Admission - Considered:  Discussed results with patient. Patient is stable and safe for discharge home.  {Document critical care time when appropriate:1} {Document review of labs and clinical decision tools ie heart score, Chads2Vasc2 etc:1}  {Document your independent review of radiology images, and any outside records:1} {Document your discussion with family members, caretakers, and with consultants:1} {Document social determinants of health affecting pt's care:1} {Document your decision making why or why not admission, treatments were  needed:1} Final Clinical Impression(s) / ED Diagnoses Final diagnoses:  None    Rx / DC Orders ED Discharge Orders     None

## 2023-02-25 NOTE — ED Triage Notes (Signed)
Pt arrived POV for a possible vaginal yeast infection, has not attempted OTC meds. Pt reports yellow" discharge, itchiness, denies vaginal bleeding.

## 2023-02-25 NOTE — ED Notes (Signed)
Urine sample sent to lab if needed.  

## 2023-02-25 NOTE — Progress Notes (Signed)
Because of the color of the discharge and recent new sexual partners, there is concern for gonorrhea or chlamydia.  I feel your condition warrants further evaluation and I recommend that you be seen in a face to face visit to rule out STD.   NOTE: There will be NO CHARGE for this eVisit   If you are having a true medical emergency please call 911.      For an urgent face to face visit, [place] has eight urgent care centers for your convenience:   NEW!! [facility] Health Urgent Care Center at [facility] Get Driving Directions [phone] [address] [place], [address]    [facility] Health Urgent Care Center at [facility] Get Driving Directions [phone] [address] [place], [place] [address]   [facility]) Get Driving Directions [phone] [address] [place], [place] [address]  [facility] Health Urgent Care Center [facility]) Get Driving Directions [phone] [address] [place],  [place]  [address]  [facility] Health Urgent Care Center [facility] - at [employer]  [phone] [address] [place],  [place] [address]   [facility] Urgent Care at [facility] Get Driving Directions [phone] [address] [place], [place] [address]   [facility] Health Urgent Care at [facility] Get Driving Directions  [phone] [address] [place], [place] [address]   [facility] Health Urgent Care at [facility] Directions [phone] [address] [place], [place] [address]  Your MyChart E-visit questionnaire answers were reviewed by a board certified advanced clinical practitioner to complete your personal care plan based on your specific symptoms.  Thank you for using e-Visits.   Approximately 5 minutes was used in reviewing the patient's chart, questionnaire, prescribing medications, and  documentation.

## 2023-02-25 NOTE — ED Provider Notes (Signed)
[facility] EMERGENCY DEPARTMENT AT [facility] Provider Note   CSN: [id number] Arrival date & time: 02/25/23  1949     History {Add pertinent medical, surgical, social history, OB history to HPI:1} Chief Complaint  Patient presents with   ***    Patricia Duran is a 27 y.o. female. - first noticed Monday --> dry and itchy some smell  but more itchy since then No dysuria   Home Medications Prior to Admission medications   Medication Sig Start Date End Date Taking? Authorizing Provider  buPROPion (WELLBUTRIN XL) 150 MG 24 hr tablet Take 1 tablet (150 mg total) by mouth daily. Patient not taking: Reported on 12/17/2021 10/16/20   Helane Rima, DO  Cholecalciferol (VITAMIN D) 50 MCG (2000 UT) CAPS Take 1 capsule (2,000 Units total) by mouth daily before breakfast. 12/29/22   Brock Bad, MD  medroxyPROGESTERone (DEPO-PROVERA) 150 MG/ML injection INJECT IN THE MUSCLE EVERY 3 MONTHS 12/17/21   Brock Bad, MD  medroxyPROGESTERone (DEPO-PROVERA) 150 MG/ML injection Inject 1 mL (150 mg total) into the muscle every 3 (three) months. 12/29/22   Brock Bad, MD  methocarbamol (ROBAXIN) 500 MG tablet Take 1 tablet (500 mg total) by mouth 2 (two) times daily. Patient not taking: Reported on 12/29/2022 02/03/22   Carlisle Beers, FNP  naproxen (NAPROSYN) 500 MG tablet Take 1 tablet (500 mg total) by mouth 2 (two) times daily. 12/29/22   Brock Bad, MD  phenazopyridine (PYRIDIUM) 200 MG tablet Take 1 tablet (200 mg total) by mouth 3 (three) times daily as needed for pain. Patient not taking: Reported on 12/17/2021 04/28/21   Brock Bad, MD  phentermine 37.5 MG capsule Take 1 capsule (37.5 mg total) by mouth every morning. 12/29/22   Brock Bad, MD  Prenat-FeFum-DSS-FA-DHA w/o A (PNV-DHA+DOCUSATE) 27-1.25-300 MG CAPS Take 1 capsule by mouth daily before breakfast. Patient not taking: Reported on 12/29/2022 12/17/21   Brock Bad, MD  tiZANidine  (ZANAFLEX) 4 MG tablet Take 4 mg by mouth every 6 (six) hours as needed. Patient not taking: Reported on 12/17/2021 02/14/21   [provider]  valACYclovir (VALTREX) 1000 MG tablet Take 1 tablet (1,000 mg total) by mouth 2 (two) times daily. Take for 5 days prn each outbreak. 12/29/22   Brock Bad, MD  Norethin Ace-Eth Estrad-FE (TAYTULLA) 1-20 MG-MCG(24) CAPS Take 1 capsule by mouth daily before breakfast. 07/11/19 08/09/19  Brock Bad, MD      Allergies    Patient has no known allergies.    Review of Systems   Review of Systems  Physical Exam Updated Vital Signs BP 136/85 (BP Location: Right Arm)   Pulse (!) 105   Temp 98.6 F (37 C)   Resp 18   Ht 5\' 8"  (1.727 m)   Wt (!) 188.7 kg   SpO2 100%   BMI 63.25 kg/m  Physical Exam  ED Results / Procedures / Treatments   Labs (all labs ordered are listed, but only abnormal results are displayed) Labs Reviewed - No data to display  EKG None  Radiology No results found.  Procedures Procedures: not indicated. {Document cardiac monitor, telemetry assessment procedure when appropriate:1}  Medications Ordered in ED Medications - No data to display  ED Course/ Medical Decision Making/ A&P   {   Click here for ABCD2, HEART and other calculatorsREFRESH Note before signing :1}  Medical Decision Making  ***  {Document critical care time when appropriate:1} {Document review of labs and clinical decision tools ie heart score, Chads2Vasc2 etc:1}  {Document your independent review of radiology images, and any outside records:1} {Document your discussion with family members, caretakers, and with consultants:1} {Document social determinants of health affecting pt's care:1} {Document your decision making why or why not admission, treatments were needed:1} Final Clinical Impression(s) / ED Diagnoses Final diagnoses:  None    Rx / DC Orders ED Discharge Orders     None

## 2023-02-25 NOTE — Progress Notes (Signed)
I feel your condition warrants further evaluation and I recommend that you be seen in a face to face visit.   NOTE: There will be NO CHARGE for this eVisit   If you are having a true medical emergency please call 911.      For an urgent face to face visit, [place] has eight urgent care centers for your convenience:   NEW!! [facility] at [place] Get Driving Directions [phone] [address] [place], [address]    [facility] [place] at [place] Get Driving Directions [phone] [address] [place], [place] [address]   [facility] ([place]) Get Driving Directions [phone] [address] [place], [place] [address]  [facility] [facility] [place]) Get Driving Directions [phone] [address] [place],  [place]  [address]  [facility] ([place] - at [facility] Get Driving Directions  [phone] [address] [place],  [place] [address]   [place] [facility] at [facility] Get Driving Directions [phone] [address] [place], [place] [address]   [place] at [facility] [place] Get Driving Directions  [phone] [address] [place], [place] [address]   [place] [facility] at [place] Get Driving Directions [phone] [address] [place], [place] [address]  Your MyChart E-visit questionnaire answers were reviewed by a board certified advanced clinical practitioner to complete your personal care plan based on your specific symptoms.  Thank you for using e-Visits.    

## 2023-02-26 MED ORDER — METRONIDAZOLE 500 MG PO TABS
500.0000 mg | ORAL_TABLET | Freq: Once | ORAL | Status: AC
  Administered 2023-02-26: 500 mg via ORAL
  Filled 2023-02-26: qty 1

## 2023-02-26 MED ORDER — METRONIDAZOLE 500 MG PO TABS: 500.0000 mg | ORAL_TABLET | Freq: Two times a day (BID) | ORAL | 0 refills | Status: AC

## 2023-02-26 NOTE — Discharge Instructions (Addendum)
As discussed, you have trichomonas.  Please take Metronidazole twice a day for 7 days to ensure resolution of the infection.  Tell your partner to get tested and treated as well.  Do not consume alcohol with taking this medication.   Your results for chlamydia and gonorrhea will come back in several days.  You will get a call from [place] if the results are positive for you to come back for treatment.  Return to ED if: Your symptoms persist or worsen despite medication use, if you develop fever, vaginal pain with sexual intercourse.

## 2023-02-27 LAB — RPR: RPR Ser Ql: NONREACTIVE

## 2023-02-28 LAB — GC/CHLAMYDIA PROBE AMP (~~LOC~~) NOT AT ARMC
Chlamydia: NEGATIVE
Comment: NEGATIVE
Comment: NORMAL
Neisseria Gonorrhea: NEGATIVE

## 2023-04-25 ENCOUNTER — Other Ambulatory Visit: Payer: Self-pay | Admitting: Obstetrics

## 2023-04-25 DIAGNOSIS — N946 Dysmenorrhea, unspecified: Secondary | ICD-10-CM

## 2023-04-27 ENCOUNTER — Ambulatory Visit: Payer: Medicaid Other

## 2023-05-09 ENCOUNTER — Ambulatory Visit (INDEPENDENT_AMBULATORY_CARE_PROVIDER_SITE_OTHER): Payer: Medicaid Other

## 2023-05-09 VITALS — BP 129/88 | HR 89 | Wt >= 6400 oz

## 2023-05-09 DIAGNOSIS — Z3042 Encounter for surveillance of injectable contraceptive: Secondary | ICD-10-CM | POA: Diagnosis not present

## 2023-05-09 MED ORDER — MEDROXYPROGESTERONE ACETATE 150 MG/ML IM SUSY
150.0000 mg | PREFILLED_SYRINGE | Freq: Once | INTRAMUSCULAR | Status: AC
  Administered 2023-05-09: 150 mg via INTRAMUSCULAR

## 2023-05-09 NOTE — Progress Notes (Signed)
Date last pap: 12/29/2022 Last Depo-Provera: 02/02/2023 Side Effects if any: Non Serum HCG indicated? N/A Depo-Provera 150 mg IM given by: Philemon Kingdom, CMA Next appointment due: Dec 2nd - Dec. 16th, 2024.

## 2023-05-19 ENCOUNTER — Telehealth: Payer: Medicaid Other | Admitting: Physician Assistant

## 2023-05-19 DIAGNOSIS — R3989 Other symptoms and signs involving the genitourinary system: Secondary | ICD-10-CM | POA: Diagnosis not present

## 2023-05-19 MED ORDER — NITROFURANTOIN MONOHYD MACRO 100 MG PO CAPS: 100.0000 mg | ORAL_CAPSULE | Freq: Two times a day (BID) | ORAL | 0 refills | Status: DC

## 2023-05-19 NOTE — Progress Notes (Signed)
E-Visit for Urinary Problems  We are sorry that you are not feeling well.  Here is how we plan to help!  Based on what you shared with me it looks like you most likely have a simple urinary tract infection.  A UTI (Urinary Tract Infection) is a bacterial infection of the bladder.  Most cases of urinary tract infections are simple to treat but a key part of your care is to encourage you to drink plenty of fluids and watch your symptoms carefully.  I have prescribed MacroBid 100 mg twice a day for 5 days.  Your symptoms should gradually improve. Call us if the burning in your urine worsens, you develop worsening fever, back pain or pelvic pain or if your symptoms do not resolve after completing the antibiotic.  Urinary tract infections can be prevented by drinking plenty of water to keep your body hydrated.  Also be sure when you wipe, wipe from front to back and don't hold it in!  If possible, empty your bladder every 4 hours.  HOME CARE Drink plenty of fluids Compete the full course of the antibiotics even if the symptoms resolve Remember, when you need to go.go. Holding in your urine can increase the likelihood of getting a UTI! GET HELP RIGHT AWAY IF: You cannot urinate You get a high fever Worsening back pain occurs You see blood in your urine You feel sick to your stomach or throw up You feel like you are going to pass out  MAKE SURE YOU  Understand these instructions. Will watch your condition. Will get help right away if you are not doing well or get worse.   Thank you for choosing an e-visit.  Your e-visit answers were reviewed by a board certified advanced clinical practitioner to complete your personal care plan. Depending upon the condition, your plan could have included both over the counter or prescription medications.  Please review your pharmacy choice. Make sure the pharmacy is open so you can pick up prescription now. If there is a problem, you may contact your  provider through MyChart messaging and have the prescription routed to another pharmacy.  Your safety is important to us. If you have drug allergies check your prescription carefully.   For the next 24 hours you can use MyChart to ask questions about today's visit, request a non-urgent call back, or ask for a work or school excuse. You will get an email in the next two days asking about your experience. I hope that your e-visit has been valuable and will speed your recovery.  I have spent 5 minutes in review of e-visit questionnaire, review and updating patient chart, medical decision making and response to patient.   Yavier Snider M Heberto Sturdevant, PA-C  

## 2023-07-27 ENCOUNTER — Ambulatory Visit: Payer: Medicaid Other

## 2023-08-04 ENCOUNTER — Ambulatory Visit: Payer: Medicaid Other

## 2023-08-04 DIAGNOSIS — Z3042 Encounter for surveillance of injectable contraceptive: Secondary | ICD-10-CM | POA: Diagnosis not present

## 2023-08-04 NOTE — Progress Notes (Signed)
Date last pap: 12/29/22. Last Depo-Provera: 05/09/23. Side Effects if any: none. Serum HCG indicated? N/A. Depo-Provera 150 mg IM given by: S.Malen Gauze, CMA.  Next appointment due 2/27-3/13/25  Administrations This Visit     medroxyPROGESTERone (DEPO-PROVERA) injection 150 mg     Admin Date 08/04/2023 Action Given Dose 150 mg Route Intramuscular Documented By Lanney Gins, CMA

## 2023-10-15 ENCOUNTER — Other Ambulatory Visit: Payer: Self-pay | Admitting: Obstetrics

## 2023-10-15 DIAGNOSIS — E66813 Body mass index (BMI) 60.0-69.9, adult: Secondary | ICD-10-CM

## 2023-10-26 ENCOUNTER — Ambulatory Visit: Payer: Medicaid Other

## 2023-10-26 DIAGNOSIS — Z3042 Encounter for surveillance of injectable contraceptive: Secondary | ICD-10-CM | POA: Diagnosis not present

## 2023-10-26 MED ORDER — MEDROXYPROGESTERONE ACETATE 150 MG/ML IM SUSP
150.0000 mg | Freq: Once | INTRAMUSCULAR | Status: AC
  Administered 2023-10-26: 150 mg via INTRAMUSCULAR

## 2023-10-26 NOTE — Progress Notes (Signed)
 Date last pap: 12/29/22. Last Depo-Provera: 08/04/23. Side Effects if any: NA. Serum HCG indicated? NA. Depo-Provera 150 mg IM given by: Karma Ganja, RN. Next appointment due May 21st - June 4th 2025.

## 2023-11-10 ENCOUNTER — Telehealth: Admitting: Physician Assistant

## 2023-11-10 DIAGNOSIS — R3989 Other symptoms and signs involving the genitourinary system: Secondary | ICD-10-CM | POA: Diagnosis not present

## 2023-11-10 MED ORDER — NITROFURANTOIN MONOHYD MACRO 100 MG PO CAPS: 100.0000 mg | ORAL_CAPSULE | Freq: Two times a day (BID) | ORAL | 0 refills | Status: AC

## 2023-11-10 NOTE — Progress Notes (Signed)

## 2023-12-20 ENCOUNTER — Ambulatory Visit: Admitting: Obstetrics

## 2023-12-20 VITALS — Ht 69.0 in | Wt >= 6400 oz

## 2023-12-20 NOTE — Progress Notes (Deleted)
 Pt presents for AEX  Last PAP 12/2022 Needs BC refill

## 2023-12-21 NOTE — Progress Notes (Signed)
 Appointment rescheduled.

## 2024-01-11 ENCOUNTER — Ambulatory Visit

## 2024-01-13 ENCOUNTER — Ambulatory Visit: Admitting: Obstetrics

## 2024-01-16 ENCOUNTER — Telehealth: Admitting: Family Medicine

## 2024-01-16 DIAGNOSIS — R399 Unspecified symptoms and signs involving the genitourinary system: Secondary | ICD-10-CM | POA: Diagnosis not present

## 2024-01-16 MED ORDER — SULFAMETHOXAZOLE-TRIMETHOPRIM 800-160 MG PO TABS: 1.0000 | ORAL_TABLET | Freq: Two times a day (BID) | ORAL | 0 refills | Status: AC

## 2024-01-16 NOTE — Progress Notes (Signed)
E-Visit for Urinary Problems  We are sorry that you are not feeling well.  Here is how we plan to help!  Based on what you shared with me it looks like you most likely have a simple urinary tract infection.  A UTI (Urinary Tract Infection) is a bacterial infection of the bladder.  Most cases of urinary tract infections are simple to treat but a key part of your care is to encourage you to drink plenty of fluids and watch your symptoms carefully.  I have prescribed Bactrim DS One tablet twice a day for 5 days.  Your symptoms should gradually improve. Call us if the burning in your urine worsens, you develop worsening fever, back pain or pelvic pain or if your symptoms do not resolve after completing the antibiotic.  Urinary tract infections can be prevented by drinking plenty of water to keep your body hydrated.  Also be sure when you wipe, wipe from front to back and don't hold it in!  If possible, empty your bladder every 4 hours.  HOME CARE Drink plenty of fluids Compete the full course of the antibiotics even if the symptoms resolve Remember, when you need to go.go. Holding in your urine can increase the likelihood of getting a UTI! GET HELP RIGHT AWAY IF: You cannot urinate You get a high fever Worsening back pain occurs You see blood in your urine You feel sick to your stomach or throw up You feel like you are going to pass out  MAKE SURE YOU  Understand these instructions. Will watch your condition. Will get help right away if you are not doing well or get worse.   Thank you for choosing an e-visit.  Your e-visit answers were reviewed by a board certified advanced clinical practitioner to complete your personal care plan. Depending upon the condition, your plan could have included both over the counter or prescription medications.  Please review your pharmacy choice. Make sure the pharmacy is open so you can pick up prescription now. If there is a problem, you may contact  your provider through [employer] and have the prescription routed to another pharmacy.  Your safety is important to [place]. If you have drug allergies check your prescription carefully.   For the next 24 hours you can use MyChart to ask questions about today's visit, request a non-urgent call back, or ask for a work or school excuse. You will get an email in the next two days asking about your experience. I hope that your e-visit has been valuable and will speed your recovery. I have spent 5 minutes in review of e-visit questionnaire, review and updating patient chart, medical decision making and response to patient.   Reed Pandy, PA-C

## 2024-01-25 ENCOUNTER — Other Ambulatory Visit (HOSPITAL_COMMUNITY)
Admission: RE | Admit: 2024-01-25 | Discharge: 2024-01-25 | Disposition: A | Discharge status: Home / Self Care | Source: Ambulatory Visit | Attending: Obstetrics | Admitting: Obstetrics

## 2024-01-25 ENCOUNTER — Ambulatory Visit (INDEPENDENT_AMBULATORY_CARE_PROVIDER_SITE_OTHER): Admitting: Obstetrics and Gynecology

## 2024-01-25 ENCOUNTER — Encounter: Payer: Self-pay | Admitting: Obstetrics and Gynecology

## 2024-01-25 ENCOUNTER — Ambulatory Visit

## 2024-01-25 VITALS — BP 140/91 | HR 90 | Ht 69.0 in | Wt >= 6400 oz

## 2024-01-25 DIAGNOSIS — Z113 Encounter for screening for infections with a predominantly sexual mode of transmission: Secondary | ICD-10-CM | POA: Diagnosis not present

## 2024-01-25 DIAGNOSIS — Z3042 Encounter for surveillance of injectable contraceptive: Secondary | ICD-10-CM

## 2024-01-25 DIAGNOSIS — Z1329 Encounter for screening for other suspected endocrine disorder: Secondary | ICD-10-CM | POA: Diagnosis not present

## 2024-01-25 DIAGNOSIS — Z01419 Encounter for gynecological examination (general) (routine) without abnormal findings: Secondary | ICD-10-CM

## 2024-01-25 DIAGNOSIS — Z114 Encounter for screening for human immunodeficiency virus [HIV]: Secondary | ICD-10-CM | POA: Diagnosis not present

## 2024-01-25 DIAGNOSIS — Z131 Encounter for screening for diabetes mellitus: Secondary | ICD-10-CM | POA: Diagnosis not present

## 2024-01-25 DIAGNOSIS — E559 Vitamin D deficiency, unspecified: Secondary | ICD-10-CM

## 2024-01-25 MED ORDER — MEDROXYPROGESTERONE ACETATE 150 MG/ML IM SUSP
150.0000 mg | Freq: Once | INTRAMUSCULAR | Status: AC
  Administered 2024-01-25: 150 mg via INTRAMUSCULAR

## 2024-01-25 NOTE — Progress Notes (Signed)
 Error, pt scheduled for nurse visit and annual visit. Pt will get depo with AEX.

## 2024-01-25 NOTE — Progress Notes (Deleted)
 Date last pap: 12/29/22. Last Depo-Provera : 10/26/23. Side Effects if any: NA. Serum HCG indicated? NA. Depo-Provera  150 mg IM given by: Artemio Bilberry, RN. Next appointment due August 20 - September 3rd 2025.

## 2024-01-25 NOTE — Progress Notes (Unsigned)
 Pt presents for annual. Pt wants all std testing and depo

## 2024-01-25 NOTE — Progress Notes (Unsigned)
 ANNUAL EXAM Patient name: Patricia Duran MRN [id number]  Date of birth: 25-Oct-1995 Chief Complaint:   Annual Exam  History of Present Illness:   Patricia Duran is a 28 y.o. G0P0000  female being seen today for a routine annual exam.  Current complaints: desires to continue depo, reports fatigue, she was prescribed vit d last year, but was unable to get it at the time.  She was taking an antibiotic for UTI, has an episode of incontinence. Not currently having symptoms   No LMP recorded (lmp unknown). Patient has had an injection.    Gynecologic History Contraception: depo Last Pap: 12/29/22. Results were: Normal Last mammogram: n/a Results were:n/a     12/17/2021    3:19 PM 09/04/2020    8:10 AM  Depression screen PHQ 2/9  Decreased Interest 0 1  Down, Depressed, Hopeless 0 0  PHQ - 2 Score 0 1  Altered sleeping 1 0  Tired, decreased energy 0 1  Change in appetite 0 1  Feeling bad or failure about yourself  0 0  Trouble concentrating 0 2  Moving slowly or fidgety/restless 0 0  Suicidal thoughts 0 0  PHQ-9 Score 1 5  Difficult doing work/chores  Somewhat difficult        12/17/2021    3:20 PM  GAD 7 : Generalized Anxiety Score  Nervous, Anxious, on Edge 1  Control/stop worrying 0  Worry too much - different things 1  Trouble relaxing 0  Restless 0  Easily annoyed or irritable 0  Afraid - awful might happen 0  Total GAD 7 Score 2  Anxiety Difficulty Not difficult at all     Review of Systems:   Pertinent items are noted in HPI Denies any headaches, blurred vision, fatigue, shortness of breath, chest pain, abdominal pain, abnormal vaginal discharge/itching/odor/irritation, problems with periods, bowel movements, urination, or intercourse unless otherwise stated above. Pertinent History Reviewed:  Reviewed past medical,surgical, social and family history.  Reviewed problem list, medications and allergies. Physical Assessment:   Vitals:   01/25/24  1028 01/25/24 1041  BP: (!) 141/90 (!) 140/91  Pulse: 96 90  Weight: (!) 405 lb 4.8 oz (183.8 kg)   Height: 5\' 9"  (1.753 m)   Body mass index is 59.85 kg/m.        Physical Examination:   General appearance - well appearing, and in no distress  Mental status - alert, oriented   Psych:  She has a normal mood and affect  Skin - warm and dry, normal color  Chest - effort normal, all lung fields clear to auscultation bilaterally  Heart - normal rate and regular rhythm  Neck:  midline trachea, no thyromegaly or nodules  Breasts - breasts appear normal, no suspicious masses, no skin or nipple changes or  axillary nodes  Abdomen - soft, nontender  Pelvic - VULVA: normal appearing vulva with no masses, tenderness or lesions  VAGINA: normal appearing vagina with normal color and discharge, no lesions  CERVIX: normal appearing cervix without discharge or lesions, no CMT  UTERUS: uterus is felt to be normal size, shape, consistency and nontender   ADNEXA: No adnexal masses or tenderness noted.  Extremities:  No swelling or varicosities noted  Chaperone present for exam  No results found for this or any previous visit (from the past 24 hours).  Assessment & Plan:  1. Encounter for annual routine gynecological examination (Primary) UTD on pap Encouraged routine self breast exam Checking routine blood work and  vit d  Follow up with uti symptoms If incontinence episodes continue follow up  2. Screen for STD (sexually transmitted disease)  - Cervicovaginal ancillary only( [place]) - HIV antibody (with reflex) - RPR - Hepatitis C Antibody - Hepatitis B Surface AntiGEN  3. Encounter for surveillance of injectable contraceptive Depo today    Labs/procedures today:   Mammogram: @ 26yo, or sooner if problems Colonoscopy: @ 28yo, or sooner if problems  Orders Placed This Encounter  Procedures   HIV antibody (with reflex)   RPR   Hepatitis C Antibody   Hepatitis B Surface AntiGEN    CBC   Comp Met (CMET)   Vitamin D  (25 hydroxy)   TSH   HgB A1c    Meds:  Meds ordered this encounter  Medications   medroxyPROGESTERone  (DEPO-PROVERA ) injection 150 mg   Cholecalciferol (VITAMIN D ) 50 MCG (2000 UT) CAPS    Sig: Take 1 capsule (2,000 Units total) by mouth daily before breakfast.    Dispense:  30 capsule    Refill:  11    Follow-up: Return in about 3 months (around 04/26/2024), or nurse visit depo provera , then one year annual.  Patricia Eric, FNP

## 2024-01-26 ENCOUNTER — Ambulatory Visit: Payer: Self-pay | Admitting: Obstetrics and Gynecology

## 2024-01-26 DIAGNOSIS — E559 Vitamin D deficiency, unspecified: Secondary | ICD-10-CM

## 2024-01-26 LAB — COMPREHENSIVE METABOLIC PANEL WITH GFR
ALT: 21 IU/L (ref 0–32)
AST: 23 IU/L (ref 0–40)
Albumin: 4.2 g/dL (ref 4.0–5.0)
Alkaline Phosphatase: 76 IU/L (ref 44–121)
BUN/Creatinine Ratio: 18 (ref 9–23)
BUN: 11 mg/dL (ref 6–20)
Bilirubin Total: 0.3 mg/dL (ref 0.0–1.2)
CO2: 20 mmol/L (ref 20–29)
Calcium: 9.1 mg/dL (ref 8.7–10.2)
Chloride: 106 mmol/L (ref 96–106)
Creatinine, Ser: 0.61 mg/dL (ref 0.57–1.00)
Globulin, Total: 2.5 g/dL (ref 1.5–4.5)
Glucose: 81 mg/dL (ref 70–99)
Potassium: 4 mmol/L (ref 3.5–5.2)
Sodium: 140 mmol/L (ref 134–144)
Total Protein: 6.7 g/dL (ref 6.0–8.5)
eGFR: 126 mL/min/{1.73_m2} (ref >59)

## 2024-01-26 LAB — HEPATITIS C ANTIBODY: Hep C Virus Ab: NONREACTIVE

## 2024-01-26 LAB — HEMOGLOBIN A1C
Est. average glucose Bld gHb Est-mCnc: 100 mg/dL
Hgb A1c MFr Bld: 5.1 % (ref 4.8–5.6)

## 2024-01-26 LAB — HIV ANTIBODY (ROUTINE TESTING W REFLEX): HIV Screen 4th Generation wRfx: NONREACTIVE

## 2024-01-26 LAB — CBC
Hematocrit: 41.4 % (ref 34.0–46.6)
Hemoglobin: 13.4 g/dL (ref 11.1–15.9)
MCH: 28.8 pg (ref 26.6–33.0)
MCHC: 32.4 g/dL (ref 31.5–35.7)
MCV: 89 fL (ref 79–97)
Platelets: 263 10*3/uL (ref 150–450)
RBC: 4.66 x10E6/uL (ref 3.77–5.28)
RDW: 13.5 % (ref 11.7–15.4)
WBC: 6.6 10*3/uL (ref 3.4–10.8)

## 2024-01-26 LAB — CERVICOVAGINAL ANCILLARY ONLY
Bacterial Vaginitis (gardnerella): POSITIVE — AB
Candida Glabrata: NEGATIVE
Candida Vaginitis: NEGATIVE
Chlamydia: NEGATIVE
Comment: NEGATIVE
Comment: NEGATIVE
Comment: NEGATIVE
Comment: NEGATIVE
Comment: NEGATIVE
Comment: NORMAL
Neisseria Gonorrhea: NEGATIVE
Trichomonas: NEGATIVE

## 2024-01-26 LAB — TSH: TSH: 2.57 u[IU]/mL (ref 0.450–4.500)

## 2024-01-26 LAB — HEPATITIS B SURFACE ANTIGEN: Hepatitis B Surface Ag: NEGATIVE

## 2024-01-26 LAB — VITAMIN D 25 HYDROXY (VIT D DEFICIENCY, FRACTURES): Vit D, 25-Hydroxy: 14.4 ng/mL — ABNORMAL LOW (ref 30.0–100.0)

## 2024-01-26 LAB — RPR: RPR Ser Ql: NONREACTIVE

## 2024-01-26 MED ORDER — VITAMIN D 50 MCG (2000 UT) PO CAPS: 1.0000 | ORAL_CAPSULE | Freq: Every day | ORAL | 11 refills | Status: AC

## 2024-01-26 MED ORDER — METRONIDAZOLE 500 MG PO TABS: 500.0000 mg | ORAL_TABLET | Freq: Two times a day (BID) | ORAL | 0 refills | Status: AC

## 2024-03-08 ENCOUNTER — Other Ambulatory Visit: Payer: Self-pay | Admitting: Obstetrics

## 2024-03-08 DIAGNOSIS — A6004 Herpesviral vulvovaginitis: Secondary | ICD-10-CM

## 2024-03-08 DIAGNOSIS — Z01419 Encounter for gynecological examination (general) (routine) without abnormal findings: Secondary | ICD-10-CM

## 2024-03-09 ENCOUNTER — Other Ambulatory Visit: Payer: Self-pay

## 2024-03-09 DIAGNOSIS — A6004 Herpesviral vulvovaginitis: Secondary | ICD-10-CM

## 2024-03-09 MED ORDER — VALACYCLOVIR HCL 1 G PO TABS: 1000.0000 mg | ORAL_TABLET | Freq: Two times a day (BID) | ORAL | 5 refills | Status: AC

## 2024-04-29 ENCOUNTER — Other Ambulatory Visit: Payer: Self-pay | Admitting: Obstetrics and Gynecology

## 2024-04-29 DIAGNOSIS — N946 Dysmenorrhea, unspecified: Secondary | ICD-10-CM

## 2024-04-30 ENCOUNTER — Ambulatory Visit

## 2024-04-30 VITALS — BP 121/88 | HR 93

## 2024-04-30 DIAGNOSIS — Z3042 Encounter for surveillance of injectable contraceptive: Secondary | ICD-10-CM

## 2024-04-30 DIAGNOSIS — Z3202 Encounter for pregnancy test, result negative: Secondary | ICD-10-CM | POA: Diagnosis not present

## 2024-04-30 LAB — POCT URINE PREGNANCY: Preg Test, Ur: NEGATIVE

## 2024-04-30 MED ORDER — MEDROXYPROGESTERONE ACETATE 150 MG/ML IM SUSP
150.0000 mg | Freq: Once | INTRAMUSCULAR | Status: AC
  Administered 2024-04-30: 150 mg via INTRAMUSCULAR

## 2024-04-30 MED ORDER — MEDROXYPROGESTERONE ACETATE 150 MG/ML IM SUSP: 150.0000 mg | INTRAMUSCULAR | 4 refills | Status: AC

## 2024-04-30 NOTE — Progress Notes (Addendum)
 Date last pap: 12/29/22; AEX 01/25/24. Last Depo-Provera : 01/25/24. Side Effects if any: N/A. Serum HCG indicated? N/A. Depo-Provera  150 mg IM given by: Margy Pizza, RN in [place]. Next appointment due November 24- July 30, 2024.   Pt's depo window was August 20-April 25, 2024. Pt is 5 days outside of depo window; per protocol, UPT obtained (negative) and dose given. Per pt, refills were not available at her pharmacy. Per pt request, depo provided from office stock.

## 2024-07-30 ENCOUNTER — Ambulatory Visit

## 2024-07-30 VITALS — BP 126/80 | HR 101 | Wt >= 6400 oz

## 2024-07-30 DIAGNOSIS — Z3042 Encounter for surveillance of injectable contraceptive: Secondary | ICD-10-CM | POA: Diagnosis not present

## 2024-07-30 NOTE — Progress Notes (Signed)
 Date last pap: 12/29/22. Last Depo-Provera : 04/30/24. Side Effects if any: N/a. Serum HCG indicated? N/a. Depo-Provera  150 mg IM in [place]. Next appointment due Feb 23- March 3. .. Administrations This Visit     medroxyPROGESTERone  (DEPO-PROVERA ) injection 150 mg     Admin Date 07/30/2024 Action Given Dose 150 mg Route Intramuscular Documented By Doneta Laymon BIRCH, RN

## 2024-10-16 ENCOUNTER — Ambulatory Visit
